# Patient Record
Sex: Female | Born: 1945 | Race: Black or African American | Hispanic: No | Marital: Single | Smoking: Former smoker
Health system: Southern US, Community
[De-identification: ages and names within clinical notes are randomized; demographics above are authoritative.]

## PROBLEM LIST (undated history)

## (undated) DIAGNOSIS — M1990 Unspecified osteoarthritis, unspecified site: Secondary | ICD-10-CM

## (undated) DIAGNOSIS — K449 Diaphragmatic hernia without obstruction or gangrene: Secondary | ICD-10-CM

## (undated) DIAGNOSIS — K5790 Diverticulosis of intestine, part unspecified, without perforation or abscess without bleeding: Secondary | ICD-10-CM

## (undated) DIAGNOSIS — R011 Cardiac murmur, unspecified: Secondary | ICD-10-CM

## (undated) DIAGNOSIS — K219 Gastro-esophageal reflux disease without esophagitis: Secondary | ICD-10-CM

## (undated) DIAGNOSIS — M810 Age-related osteoporosis without current pathological fracture: Secondary | ICD-10-CM

## (undated) DIAGNOSIS — I10 Essential (primary) hypertension: Secondary | ICD-10-CM

## (undated) DIAGNOSIS — I341 Nonrheumatic mitral (valve) prolapse: Secondary | ICD-10-CM

## (undated) HISTORY — PX: COLONOSCOPY: SHX174

## (undated) HISTORY — PX: KNEE SURGERY: SHX244

## (undated) HISTORY — DX: Age-related osteoporosis without current pathological fracture: M81.0

## (undated) HISTORY — PX: BACK SURGERY: SHX140

## (undated) HISTORY — PX: CARPAL TUNNEL RELEASE: SHX101

---

## 1997-08-23 ENCOUNTER — Other Ambulatory Visit: Admission: RE | Admit: 1997-08-23 | Discharge: 1997-08-23 | Payer: Self-pay | Admitting: Obstetrics & Gynecology

## 1997-12-27 ENCOUNTER — Other Ambulatory Visit: Admission: RE | Admit: 1997-12-27 | Discharge: 1997-12-27 | Payer: Self-pay | Admitting: Obstetrics & Gynecology

## 1998-09-13 ENCOUNTER — Other Ambulatory Visit: Admission: RE | Admit: 1998-09-13 | Discharge: 1998-09-13 | Payer: Self-pay | Admitting: Obstetrics & Gynecology

## 1999-06-03 ENCOUNTER — Inpatient Hospital Stay (HOSPITAL_COMMUNITY): Admission: EM | Admit: 1999-06-03 | Discharge: 1999-06-05 | Payer: Self-pay | Admitting: Emergency Medicine

## 1999-06-03 ENCOUNTER — Encounter: Payer: Self-pay | Admitting: Specialist

## 1999-06-04 ENCOUNTER — Encounter: Payer: Self-pay | Admitting: Specialist

## 1999-06-13 ENCOUNTER — Encounter: Payer: Self-pay | Admitting: Specialist

## 1999-06-14 ENCOUNTER — Encounter: Payer: Self-pay | Admitting: Specialist

## 1999-06-14 ENCOUNTER — Observation Stay (HOSPITAL_COMMUNITY): Admission: RE | Admit: 1999-06-14 | Discharge: 1999-06-15 | Payer: Self-pay | Admitting: Specialist

## 2000-10-29 ENCOUNTER — Other Ambulatory Visit: Admission: RE | Admit: 2000-10-29 | Discharge: 2000-10-29 | Payer: Self-pay | Admitting: Gastroenterology

## 2001-12-31 ENCOUNTER — Other Ambulatory Visit: Admission: RE | Admit: 2001-12-31 | Discharge: 2001-12-31 | Payer: Self-pay | Admitting: Obstetrics and Gynecology

## 2002-12-20 ENCOUNTER — Ambulatory Visit (HOSPITAL_COMMUNITY): Admission: RE | Admit: 2002-12-20 | Discharge: 2002-12-20 | Payer: Self-pay | Admitting: Orthopedic Surgery

## 2002-12-20 ENCOUNTER — Ambulatory Visit (HOSPITAL_BASED_OUTPATIENT_CLINIC_OR_DEPARTMENT_OTHER): Admission: RE | Admit: 2002-12-20 | Discharge: 2002-12-20 | Payer: Self-pay | Admitting: Orthopedic Surgery

## 2003-01-03 ENCOUNTER — Other Ambulatory Visit: Admission: RE | Admit: 2003-01-03 | Discharge: 2003-01-03 | Payer: Self-pay | Admitting: Obstetrics & Gynecology

## 2003-08-24 ENCOUNTER — Ambulatory Visit (HOSPITAL_COMMUNITY): Admission: RE | Admit: 2003-08-24 | Discharge: 2003-08-24 | Payer: Self-pay | Admitting: Gastroenterology

## 2003-11-07 ENCOUNTER — Ambulatory Visit (HOSPITAL_COMMUNITY): Admission: RE | Admit: 2003-11-07 | Discharge: 2003-11-07 | Payer: Self-pay | Admitting: Orthopedic Surgery

## 2003-11-07 ENCOUNTER — Ambulatory Visit (HOSPITAL_BASED_OUTPATIENT_CLINIC_OR_DEPARTMENT_OTHER): Admission: RE | Admit: 2003-11-07 | Discharge: 2003-11-07 | Payer: Self-pay | Admitting: Orthopedic Surgery

## 2004-01-16 ENCOUNTER — Other Ambulatory Visit: Admission: RE | Admit: 2004-01-16 | Discharge: 2004-01-16 | Payer: Self-pay | Admitting: Obstetrics & Gynecology

## 2011-11-16 ENCOUNTER — Encounter (HOSPITAL_BASED_OUTPATIENT_CLINIC_OR_DEPARTMENT_OTHER): Payer: Self-pay | Admitting: Emergency Medicine

## 2011-11-16 ENCOUNTER — Emergency Department (HOSPITAL_BASED_OUTPATIENT_CLINIC_OR_DEPARTMENT_OTHER)
Admission: EM | Admit: 2011-11-16 | Discharge: 2011-11-16 | Disposition: A | Discharge status: Home / Self Care | Payer: Self-pay | Attending: Emergency Medicine | Admitting: Emergency Medicine

## 2011-11-16 DIAGNOSIS — I10 Essential (primary) hypertension: Secondary | ICD-10-CM | POA: Insufficient documentation

## 2011-11-16 DIAGNOSIS — M129 Arthropathy, unspecified: Secondary | ICD-10-CM | POA: Insufficient documentation

## 2011-11-16 DIAGNOSIS — I059 Rheumatic mitral valve disease, unspecified: Secondary | ICD-10-CM | POA: Insufficient documentation

## 2011-11-16 DIAGNOSIS — N949 Unspecified condition associated with female genital organs and menstrual cycle: Secondary | ICD-10-CM | POA: Insufficient documentation

## 2011-11-16 DIAGNOSIS — K219 Gastro-esophageal reflux disease without esophagitis: Secondary | ICD-10-CM | POA: Insufficient documentation

## 2011-11-16 DIAGNOSIS — R350 Frequency of micturition: Secondary | ICD-10-CM | POA: Insufficient documentation

## 2011-11-16 DIAGNOSIS — R3915 Urgency of urination: Secondary | ICD-10-CM | POA: Insufficient documentation

## 2011-11-16 DIAGNOSIS — R102 Pelvic and perineal pain: Secondary | ICD-10-CM

## 2011-11-16 DIAGNOSIS — Z882 Allergy status to sulfonamides status: Secondary | ICD-10-CM | POA: Insufficient documentation

## 2011-11-16 HISTORY — DX: Diaphragmatic hernia without obstruction or gangrene: K44.9

## 2011-11-16 HISTORY — DX: Gastro-esophageal reflux disease without esophagitis: K21.9

## 2011-11-16 HISTORY — DX: Nonrheumatic mitral (valve) prolapse: I34.1

## 2011-11-16 HISTORY — DX: Unspecified osteoarthritis, unspecified site: M19.90

## 2011-11-16 HISTORY — DX: Essential (primary) hypertension: I10

## 2011-11-16 HISTORY — DX: Diverticulosis of intestine, part unspecified, without perforation or abscess without bleeding: K57.90

## 2011-11-16 LAB — URINE MICROSCOPIC-ADD ON

## 2011-11-16 LAB — URINALYSIS, ROUTINE W REFLEX MICROSCOPIC
Bilirubin Urine: NEGATIVE
Glucose, UA: NEGATIVE mg/dL
Hgb urine dipstick: NEGATIVE
Ketones, ur: NEGATIVE mg/dL
Nitrite: NEGATIVE
Protein, ur: NEGATIVE mg/dL
Specific Gravity, Urine: 1.008 (ref 1.005–1.030)
Urobilinogen, UA: 0.2 mg/dL (ref 0.0–1.0)
pH: 7 (ref 5.0–8.0)

## 2011-11-16 NOTE — ED Notes (Signed)
Pt states she has been having ongoing problems with dysuria and possible vaginal swelling since April.  Pt states some burning, some frequency and difficulty with emptying bladder.   No known fever.  Has been seen numerous times for same.  Pt states symptoms are progressively worsening.

## 2011-11-16 NOTE — ED Provider Notes (Signed)
History     CSN: [id number]  Arrival date & time 11/16/11  1035   First MD Initiated Contact with Patient 11/16/11 1059      Chief Complaint  Patient presents with  . Dysuria  . vaginal swelling     (Consider location/radiation/quality/duration/timing/severity/associated sxs/prior treatment) HPI Pt reports several months of bladder problems, described as a swelling or mass in her vagina with standing, urinary urgency and frequency with difficult completely voiding. She has been seen by Urology in [facility] and at [facility] multiple times. It seems they are concerned about possible bladder prolapse but surgical intervention has been delayed by lack of insurance. The patient has applied for Medicaid within the last week. She presents to the ED today for re-evaluation. None of her prior evaluation is available in our medical record.   Past Medical History  Diagnosis Date  . Hypertension   . Hiatal hernia   . GERD (gastroesophageal reflux disease)   . Diverticulosis   . Arthritis   . Mitral valve prolapse     Past Surgical History  Procedure Date  . Back surgery   . Carpal tunnel release   . Knee surgery     No family history on file.  History  Substance Use Topics  . Smoking status: Not on file  . Smokeless tobacco: Not on file  . Alcohol Use:     OB History    Grav Para Term Preterm Abortions TAB SAB Ect Mult Living                  Review of Systems All other systems reviewed and are negative except as noted in HPI.   Allergies  Sulfa antibiotics  Home Medications   Current Outpatient Rx  Name Route Sig Dispense Refill  . AMITRIPTYLINE HCL 25 MG PO TABS Oral Take 25 mg by mouth at bedtime.    Marland Kitchen AMLODIPINE BESYLATE 5 MG PO TABS Oral Take 5 mg by mouth daily.    Marland Kitchen ESOMEPRAZOLE MAGNESIUM 20 MG PO CPDR Oral Take 20 mg by mouth daily before breakfast.    . HYDROCHLOROTHIAZIDE 12.5 MG PO CAPS Oral Take 12.5 mg by mouth daily.    Marland Kitchen METOPROLOL TARTRATE 50 MG PO  TABS Oral Take 50 mg by mouth 2 (two) times daily.      BP 163/67  Pulse 95  Temp 98.1 F (36.7 C) (Oral)  Resp 16  Ht 5\' 3"  (1.6 m)  Wt 165 lb (74.844 kg)  BMI 29.23 kg/m2  SpO2 98%  Physical Exam  Nursing note and vitals reviewed. Constitutional: She is oriented to person, place, and time. She appears well-developed and well-nourished.  HENT:  Head: Normocephalic and atraumatic.  Eyes: EOM are normal. Pupils are equal, round, and reactive to light.  Neck: Normal range of motion. Neck supple.  Cardiovascular: Normal rate, normal heart sounds and intact distal pulses.   Pulmonary/Chest: Effort normal and breath sounds normal.  Abdominal: Bowel sounds are normal. She exhibits no distension. There is no tenderness.  Genitourinary: Vagina normal.       No uterine or bladder prolapse on supine exam  Musculoskeletal: Normal range of motion. She exhibits no edema and no tenderness.  Neurological: She is alert and oriented to person, place, and time. She has normal strength. No cranial nerve deficit or sensory deficit.  Skin: Skin is warm and dry. No rash noted.  Psychiatric: She has a normal mood and affect.    ED Course  Procedures (including  critical care time)  Labs Reviewed  URINALYSIS, ROUTINE W REFLEX MICROSCOPIC - Abnormal; Notable for the following:    Leukocytes, UA SMALL (*)     All other components within normal limits  URINE MICROSCOPIC-ADD ON   No results found.   No diagnosis found.    MDM  Pt advised Urology and/or Gyn followup for further evaluation and management. Suspect bladder prolapse or possibly uterine prolapse. UA is neg.         Jaquille Kau B. Bernette Mayers, MD 11/16/11 1231

## 2011-12-05 ENCOUNTER — Ambulatory Visit (INDEPENDENT_AMBULATORY_CARE_PROVIDER_SITE_OTHER): Payer: Self-pay | Admitting: Obstetrics & Gynecology

## 2011-12-05 ENCOUNTER — Encounter: Payer: Self-pay | Admitting: Obstetrics & Gynecology

## 2011-12-05 VITALS — BP 125/73 | HR 80 | Temp 97.4°F | Ht 62.0 in | Wt 165.8 lb

## 2011-12-05 DIAGNOSIS — N819 Female genital prolapse, unspecified: Secondary | ICD-10-CM | POA: Insufficient documentation

## 2011-12-05 DIAGNOSIS — N393 Stress incontinence (female) (male): Secondary | ICD-10-CM

## 2011-12-05 NOTE — Progress Notes (Signed)
Patient ID: Brenda Mccullough, female   DOB: Feb 21, 1946, 66 y.o.   MRN: [id number]  Chief Complaint  Patient presents with  . Follow-up    from ED, possible bladder prolapse     HPI Brenda Mccullough is a 66 y.o. female.  [id number] No LMP recorded. Patient is postmenopausal. Over one year of symptoms of pelvic prolapse, urinary incontinence, has been seen by urology at [facility] and [facility]. Had MRI in [place] HPI  Past Medical History  Diagnosis Date  . Hypertension   . Hiatal hernia   . GERD (gastroesophageal reflux disease)   . Diverticulosis   . Arthritis   . Mitral valve prolapse     Past Surgical History  Procedure Date  . Back surgery   . Carpal tunnel release   . Knee surgery     No family history on file.  Social History History  Substance Use Topics  . Smoking status: Former Games developer  . Smokeless tobacco: Never Used  . Alcohol Use: Not on file    Allergies  Allergen Reactions  . Sulfa Antibiotics     Current Outpatient Prescriptions  Medication Sig Dispense Refill  . amitriptyline (ELAVIL) 25 MG tablet Take 25 mg by mouth at bedtime.      Marland Kitchen amLODipine (NORVASC) 5 MG tablet Take 5 mg by mouth daily.      Marland Kitchen esomeprazole (NEXIUM) 20 MG capsule Take 20 mg by mouth daily before breakfast.      . hydrochlorothiazide (MICROZIDE) 12.5 MG capsule Take 12.5 mg by mouth daily.      . metoprolol (LOPRESSOR) 50 MG tablet Take 50 mg by mouth 2 (two) times daily.        Review of Systems Review of Systems  Constitutional: Negative for activity change.  Genitourinary: Positive for urgency. Negative for dysuria.       Stress incontinence Prolapse symptoms when on her feet for prolonged periods    Blood pressure 125/73, pulse 80, temperature 97.4 F (36.3 C), temperature source Oral, height 5\' 2"  (1.575 m), weight 165 lb 12.8 oz (75.206 kg).  Physical Exam Physical Exam  Constitutional: She appears well-developed. No distress.  Pulmonary/Chest: Effort normal. No  respiratory distress.  Abdominal: Soft. She exhibits no distension and no mass. There is no tenderness.  Genitourinary: Vagina normal and uterus normal. No vaginal discharge found.       No prolapse on brief standing. Pap done  Skin: Skin is warm and dry.  Psychiatric: She has a normal mood and affect. Her behavior is normal.    Data Reviewed   Assessment    Reports prolapse and incontinence    Plan    Records from urology Consider pessary-ring with incontinence support       ARNOLD,JAMES 12/05/2011, 3:24 PM

## 2011-12-05 NOTE — Patient Instructions (Signed)
Prolapse  Prolapse means the falling down, bulging, dropping, or drooping of a body part. Organs that commonly prolapse include the rectum, small intestine, bladder, urethra, vagina (birth canal), uterus (womb), and cervix. Prolapse occurs when the ligaments and muscle tissue around the rectum, bladder, and uterus are damaged or weakened.  CAUSES  This happens especially with:  Childbirth. Some women feel pelvic pressure or have trouble holding their urine right after childbirth, because of stretching and tearing of pelvic tissues. This generally gets better with time and the feeling usually goes away, but it may return with aging.  Chronic heavy lifting.  Aging.  Menopause, with loss of estrogen production weakening the pelvic ligaments and muscles.  Past pelvic surgery.  Obesity.  Chronic constipation.  Chronic cough. Prolapse may affect a single organ, or several organs may prolapse at the same time. The front wall of the vagina holds up the bladder. The back wall holds up part of the lower intestine, or rectum. The uterus fills a spot in the middle. All these organs can be involved when the ligaments and muscles around the vagina relax too much. This often gets worse when women stop producing estrogen (menopause). SYMPTOMS  Uncontrolled loss of urine (incontinence) with cough, sneeze, straining, and exercise.  More force may be required to have a bowel movement, due to trapping of the stool.  When part of an organ bulges through the opening of the vagina, there is sometimes a feeling of heaviness or pressure. It may feel as though something is falling out. This sensation increases with coughing or bearing down.  If the organs protrude through the opening of the vagina and rub against the clothing, there may be soreness, ulcers, infection, pain, and bleeding.  Lower back pain.  Pushing in the upper or lower part of the vagina, to pass urine or have a bowel movement.  Problems  having sexual intercourse.  Being unable to insert a tampon or applicator. DIAGNOSIS  Usually, a physical exam is all that is needed to identify the problem. During the examination, you may be asked to cough and strain while lying down, sitting up, and standing up. Your caregiver will determine if more testing is required, such as bladder function tests. Some diagnoses are:  Cystocele: Bulging and falling of the bladder into the top of the vagina.  Rectocele: Part of the rectum bulging into the vagina.  Prolapse of the uterus: The uterus falls or drops into the vagina.  Enterocele: Bulging of the top of the vagina, after a hysterectomy (uterus removal), with the small intestine bulging into the vagina. A hernia in the top of the vagina.  Urethrocele: The urethra (urine carrying tube) bulging into the vagina. TREATMENT  In most cases, prolapse needs to be treated only if it produces symptoms. If the symptoms are interfering with your usual daily or sexual activities, treatment may be necessary. The following are some measures that may be used to treat prolapse.  Estrogen may help elderly women with mild prolapse.  Kegel exercises may help mild cases of prolapse, by strengthening and tightening the muscles of the pelvic floor.  Pessaries are used in women who choose not to, or are unable to, have surgery. A pessary is a doughnut-shaped piece of plastic or rubber that is put into the vagina to keep the organs in place. This device must be fitted by your caregiver. Your caregiver will also explain how to care for yourself with the pessary. If it works well for you,   to keep the organs in place. This device must be fitted by your caregiver. Your caregiver will also explain how to care for yourself with the pessary. If it works well for you, this may be the only treatment required.   Surgery is often the only form of treatment for more severe prolapses. There are different types of surgery available. You should discuss what the best procedure is for you. If the uterus is prolapsed, it may be removed (hysterectomy) as part of the surgical treatment. Your caregiver will  discuss the risks and benefits with you.   Uterine-vaginal suspension (surgery to hold up the organs) may be used, especially if you want to maintain your fertility.  No form of treatment is guaranteed to correct the prolapse or relieve the symptoms.  HOME CARE INSTRUCTIONS    Wear a sanitary pad or absorbent product if you have incontinence of urine.   Avoid heavy lifting and straining with exercise and work.   Take over-the-counter pain medicine for minor discomfort.   Try taking estrogen or using estrogen vaginal cream.   Try Kegel exercises or use a pessary, before deciding to have surgery.   Do Kegel exercises after having a baby.  SEEK MEDICAL CARE IF:    Your symptoms interfere with your daily activities.   You need medicine to help with the discomfort.   You need to be fitted with a pessary.   You notice bleeding from the vagina.   You think you have ulcers or you notice ulcers on the cervix.   You have an oral temperature above 102 F (38.9 C).   You develop pain or blood with urination.   You have bleeding with a bowel movement.   The symptoms are interfering with your sex life.   You have urinary incontinence that interferes with your daily activities.   You lose urine with sexual intercourse.   You have a chronic cough.   You have chronic constipation.  Document Released: 08/18/2002 Document Revised: 05/06/2011 Document Reviewed: 02/26/2009  [employer] Patient Information 2013 [employer].

## 2011-12-05 NOTE — Addendum Note (Signed)
Addended by: Sherre Lain A on: 12/05/2011 03:47 PM   Modules accepted: Orders

## 2011-12-11 ENCOUNTER — Telehealth: Payer: Self-pay | Admitting: *Deleted

## 2011-12-11 NOTE — Telephone Encounter (Signed)
Called pt and discussed her spotting. She states that she had 1 episode "the other day while I was @ my job". She denies spotting today. I informed pt that she does not need to be seen sooner than her scheduled appt on 12/23/11. She should make the doctor aware of her spotting episode and any additional bleeding that she has between now and the appt. Pt voiced understanding.

## 2011-12-11 NOTE — Telephone Encounter (Signed)
Patient left a message stating that she has started spotting yesterday. She has a followup with Dr. Debroah Loop on 10/28. She wants to know if she needs to come in sooner since she has started the spotting.

## 2011-12-23 ENCOUNTER — Ambulatory Visit (INDEPENDENT_AMBULATORY_CARE_PROVIDER_SITE_OTHER): Payer: Self-pay | Admitting: Obstetrics & Gynecology

## 2011-12-23 ENCOUNTER — Encounter: Payer: Self-pay | Admitting: Obstetrics & Gynecology

## 2011-12-23 VITALS — BP 148/78 | HR 80 | Temp 97.3°F | Ht 62.0 in | Wt 166.6 lb

## 2011-12-23 DIAGNOSIS — IMO0002 Reserved for concepts with insufficient information to code with codable children: Secondary | ICD-10-CM

## 2011-12-23 DIAGNOSIS — N8111 Cystocele, midline: Secondary | ICD-10-CM

## 2011-12-23 DIAGNOSIS — N393 Stress incontinence (female) (male): Secondary | ICD-10-CM

## 2011-12-23 DIAGNOSIS — Z23 Encounter for immunization: Secondary | ICD-10-CM

## 2011-12-23 MED ORDER — INFLUENZA VIRUS VACC SPLIT PF IM SUSP
0.5000 mL | Freq: Once | INTRAMUSCULAR | Status: AC
  Administered 2011-12-23: 0.5 mL via INTRAMUSCULAR

## 2011-12-23 NOTE — Addendum Note (Signed)
Addended by: Allie Bossier on: 12/23/2011 02:23 PM   Modules accepted: Orders

## 2011-12-23 NOTE — Progress Notes (Signed)
Here today for pessary fitting. States still having problems sometimes when passing urine , states sometimes it feels like a baby's head coming out and she has seen it too.

## 2011-12-23 NOTE — Progress Notes (Signed)
  Subjective:    Patient ID: Brenda Mccullough, female    DOB: 01-13-1946, 66 y.o.   MRN: [id number]  HPI  66 yo abstinent lady who has been dealing with a cystocele and GSUI for about a year. She is here today for a pessary fitting. Review of Systems     Objective:   Physical Exam  A #4 ring pessary was placed without difficulty. This relieved her symptoms. She was able to remove and insert it.  Minimal atrophy       Assessment & Plan:  Cystocele and GSUI I will see her back in a month. If there is any vaginal abrasion, I will add vaginal estrogen.

## 2011-12-23 NOTE — Addendum Note (Signed)
Addended by: Freddi Starr on: 12/23/2011 02:25 PM   Modules accepted: Orders

## 2011-12-26 ENCOUNTER — Telehealth: Payer: Self-pay | Admitting: *Deleted

## 2011-12-26 NOTE — Telephone Encounter (Signed)
Pt left message that she was in office on 10/28 and received device to hold her bladder up from Dr. Marice Potter. She was unable to remove it yesterday and thinks it may be too small. Pt states that Dr. Marice Potter told her she could return if it was not the right size. Please call back.  I consulted with Dr. Marice Potter, then called pt.  She stated that she was finally able to remove the pessary and did not re-insert it for fear that she would not be able to get it out again. I advised pt to re-insert the pessary tonight or tomorrow morning and then to leave it in for a couple of days. If she has difficulty removing it again, she should call us back next week. She also should try different positions while removing the pessary such as squatting, standing in the shower or lying down.  Pt voiced understanding.

## 2012-01-22 ENCOUNTER — Ambulatory Visit: Payer: Self-pay | Admitting: Obstetrics & Gynecology

## 2012-02-05 ENCOUNTER — Ambulatory Visit (INDEPENDENT_AMBULATORY_CARE_PROVIDER_SITE_OTHER): Payer: Self-pay | Admitting: Obstetrics & Gynecology

## 2012-02-05 ENCOUNTER — Encounter: Payer: Self-pay | Admitting: Obstetrics & Gynecology

## 2012-02-05 VITALS — BP 142/75 | HR 81 | Temp 98.7°F | Ht 63.0 in | Wt 166.8 lb

## 2012-02-05 DIAGNOSIS — N814 Uterovaginal prolapse, unspecified: Secondary | ICD-10-CM

## 2012-02-05 DIAGNOSIS — M25569 Pain in unspecified knee: Secondary | ICD-10-CM

## 2012-02-05 MED ORDER — AMITRIPTYLINE HCL 25 MG PO TABS: 25.0000 mg | ORAL_TABLET | Freq: Every day | ORAL | Status: DC

## 2012-02-05 NOTE — Patient Instructions (Signed)
Prolapse  Prolapse means the falling down, bulging, dropping, or drooping of a body part. Organs that commonly prolapse include the rectum, small intestine, bladder, urethra, vagina (birth canal), uterus (womb), and cervix. Prolapse occurs when the ligaments and muscle tissue around the rectum, bladder, and uterus are damaged or weakened.  CAUSES  This happens especially with:  Childbirth. Some women feel pelvic pressure or have trouble holding their urine right after childbirth, because of stretching and tearing of pelvic tissues. This generally gets better with time and the feeling usually goes away, but it may return with aging.  Chronic heavy lifting.  Aging.  Menopause, with loss of estrogen production weakening the pelvic ligaments and muscles.  Past pelvic surgery.  Obesity.  Chronic constipation.  Chronic cough. Prolapse may affect a single organ, or several organs may prolapse at the same time. The front wall of the vagina holds up the bladder. The back wall holds up part of the lower intestine, or rectum. The uterus fills a spot in the middle. All these organs can be involved when the ligaments and muscles around the vagina relax too much. This often gets worse when women stop producing estrogen (menopause). SYMPTOMS  Uncontrolled loss of urine (incontinence) with cough, sneeze, straining, and exercise.  More force may be required to have a bowel movement, due to trapping of the stool.  When part of an organ bulges through the opening of the vagina, there is sometimes a feeling of heaviness or pressure. It may feel as though something is falling out. This sensation increases with coughing or bearing down.  If the organs protrude through the opening of the vagina and rub against the clothing, there may be soreness, ulcers, infection, pain, and bleeding.  Lower back pain.  Pushing in the upper or lower part of the vagina, to pass urine or have a bowel movement.  Problems  having sexual intercourse.  Being unable to insert a tampon or applicator. DIAGNOSIS  Usually, a physical exam is all that is needed to identify the problem. During the examination, you may be asked to cough and strain while lying down, sitting up, and standing up. Your caregiver will determine if more testing is required, such as bladder function tests. Some diagnoses are:  Cystocele: Bulging and falling of the bladder into the top of the vagina.  Rectocele: Part of the rectum bulging into the vagina.  Prolapse of the uterus: The uterus falls or drops into the vagina.  Enterocele: Bulging of the top of the vagina, after a hysterectomy (uterus removal), with the small intestine bulging into the vagina. A hernia in the top of the vagina.  Urethrocele: The urethra (urine carrying tube) bulging into the vagina. TREATMENT  In most cases, prolapse needs to be treated only if it produces symptoms. If the symptoms are interfering with your usual daily or sexual activities, treatment may be necessary. The following are some measures that may be used to treat prolapse.  Estrogen may help elderly women with mild prolapse.  Kegel exercises may help mild cases of prolapse, by strengthening and tightening the muscles of the pelvic floor.  Pessaries are used in women who choose not to, or are unable to, have surgery. A pessary is a doughnut-shaped piece of plastic or rubber that is put into the vagina to keep the organs in place. This device must be fitted by your caregiver. Your caregiver will also explain how to care for yourself with the pessary. If it works well for you,   this may be the only treatment required.  Surgery is often the only form of treatment for more severe prolapses. There are different types of surgery available. You should discuss what the best procedure is for you. If the uterus is prolapsed, it may be removed (hysterectomy) as part of the surgical treatment. Your caregiver will  discuss the risks and benefits with you.  Uterine-vaginal suspension (surgery to hold up the organs) may be used, especially if you want to maintain your fertility. No form of treatment is guaranteed to correct the prolapse or relieve the symptoms. HOME CARE INSTRUCTIONS   Wear a sanitary pad or absorbent product if you have incontinence of urine.  Avoid heavy lifting and straining with exercise and work.  Take over-the-counter pain medicine for minor discomfort.  Try taking estrogen or using estrogen vaginal cream.  Try Kegel exercises or use a pessary, before deciding to have surgery.  Do Kegel exercises after having a baby. SEEK MEDICAL CARE IF:   Your symptoms interfere with your daily activities.  You need medicine to help with the discomfort.  You need to be fitted with a pessary.  You notice bleeding from the vagina.  You think you have ulcers or you notice ulcers on the cervix.  You have an oral temperature above 102 F (38.9 C).  You develop pain or blood with urination.  You have bleeding with a bowel movement.  The symptoms are interfering with your sex life.  You have urinary incontinence that interferes with your daily activities.  You lose urine with sexual intercourse.  You have a chronic cough.  You have chronic constipation. Document Released: 08/18/2002 Document Revised: 05/06/2011 Document Reviewed: 02/26/2009 [facility] Patient Information 2013 [place], [place]. Cystocele Repair A cystocele is a bulging, drooping hernia or break (rupture) of bladder tissue into the birth canal (vagina). This bulging or rupture occurs on the top front wall of the vagina. CAUSES  Cystocele is associated with weakness of the top front wall of the vagina due to stretching and tearing of the ligaments and muscles in the area. This is often the result of:  Multiple childbirths.  Continuous heavy lifting.  Chronic cough from asthma, emphysema, or smoking.  Being  overweight.  Changes from aging.  Previous surgery in the vaginal area.  Menopause with loss of estrogen hormone and weakening of the ligaments and muscles around the bladder. SYMPTOMS   Uncontrolled loss of urine (incontinence) with cough, sneeze, or exercise.  Pelvic pressure.  Frequency or urgency to urinate because of inability to completely empty the bladder.  Bladder infections.  Needing to push on the upper vagina to help yourself pass urine. DIAGNOSIS  A cystocele can be diagnosed by doing a pelvic exam and observing the top of the vagina drooping or bulging into or out of the vagina. TREATMENT  Surgical options:  Cystocele repair is surgery that removes the hernia.  There are also different "sling" operations that may be used. Discuss the different types of surgeries to repair a cystocele with your caregiver. Your caregiver will decide what type of surgery will be best in your case. Nonsurgical options:  Kegel exercises. This helps strengthen and tighten the muscles and tissue in and around the bladder and vagina. This may help with mild cases of cystocele.  A pessary may help the cystocele. A pessary is a plastic or rubber device that lifts the bladder into place. A pessary must be fitted by a doctor.  Tampons or diaphragms that lift the bladder  into place are sometimes helpful with a minor or small cystocele.  Estrogen may help with mild cases in menopausal and aging women. LET YOUR CAREGIVER KNOW ABOUT:   Allergies to food or medicine.  Medicines taken, including vitamins, herbs, eyedrops, over-the-counter medicines, and creams.  Use of steroids (by mouth or creams).  Previous problems with anesthetics or numbing medicines.  History of bleeding problems or blood clots.  Previous surgery.  Other health problems, including diabetes and kidney problems.  Possibility of pregnancy, if this applies. RISKS AND COMPLICATIONS  All surgery is associated with  risks.  There are risks with a general anesthesia. You should discuss this with your caregiver.  With spinal or epidural anesthesia, there may be an area that is not numbed, and you could feel pain.  Headache could occur with a spinal or epidural anesthetic.  The catheter you will have after surgery may not work properly or may get blocked and need to be replaced.  Excessive bleeding.  Infection.  Injury to surrounding structures.  Recurrence of the cystocele.  Surgery may not get rid of your symptoms. BEFORE THE PROCEDURE   Do not take aspirin or blood thinners for 1 week prior to surgery, unless instructed otherwise.  Do not eat or drink anything after midnight the night before surgery.  Let your caregiver know if you develop a cold or other infectious problems prior to surgery.  If being admitted the day of surgery, you should be present 1 hour prior to your procedure or as directed by your caregiver.  Plan and arrange for help when you go home from the hospital.  If you smoke, do not smoke for at least 2 weeks before the surgery.  Do not drink any alcohol for 3 days before the surgery. PROCEDURE  You will be given an anesthetic to prevent you from feeling pain during surgery. This may be a general anesthetic that puts you to sleep, or a spinal or epidural anesthetic. You will be asleep or be numbed through the entire procedure. During cystocele repair, tissue is pulled from the sides and around the top of the vagina to lift up the hernia. This removes the hernia so that the top of the vagina does not fall into the opening of the vagina. AFTER THE PROCEDURE  After surgery, you will be taken to the recovery room where a nurse will take care of you, checking your breathing, blood pressure, pulse, and your progress. When your caregiver feels you are stable, you will be taken to your room. You will have a drainage tube (Foley catheter) that will drain your bladder for 2 to 7 days  or longer, until your bladder is working properly. This catheter is placed prior to surgery to help keep your bladder empty and out of the way during the procedure. After surgery, this will make passing your urine easier. The catheter will be removed when you can easily pass urine without this assistance. You may have gauze packing in the vagina that will be removed 1 to 2 days after the surgery. Usually, you will be given a medicine (antibiotic) that kills germs. You will be given pain medicine as needed. You can usually go home in 3 to 5 days. HOME CARE INSTRUCTIONS   Do not take baths. Take showers until your caregiver informs you otherwise.  Take antibiotics as directed by your caregiver.  Exercise as instructed. Do not perform exercises which increase the pressure inside your belly (abdomen), such as sit-ups or lifting  weights, until your caregiver has given permission. Walking exercise is preferred.  Only take over-the-counter or prescription medicines for pain and discomfort as directed by your caregiver.  Do not drink alcohol while taking pain medicine.  Do not lift anything over 5 pounds.  Do not drive until your caregiver gives you permission.  Get plenty of rest and sleep.  Have someone help with your household chores for 1 to 2 weeks.  If you develop constipation, you may take a mild laxative with your caregiver's permission. Eating bran foods and drinking enough water and fluids to keep your urine clear or pale yellow helps with constipation.  Do not take aspirin. It may cause bleeding.  You may resume normal diet and unstrenuous activities as directed.  Do not douche, use tampons, or engage in intercourse until your surgeon has given permission.  Change bandages (dressings) as directed.  Make and keep all your postoperative appointments. SEEK MEDICAL CARE IF:   You have abnormal vaginal discharge.  You develop a rash.  You are having a reaction to your  medicine.  You develop nausea or vomiting. SEEK IMMEDIATE MEDICAL CARE IF:   You have redness, swelling, or increasing pain in the vaginal area.  You notice pus coming from the vagina.  You have a fever.  You notice a bad smell coming from the vagina.  You have increasing abdominal pain.  You have frequent urination or you notice burning during urination.  You notice blood in your urine.  You have excessive vaginal bleeding.  You cannot urinate. MAKE SURE YOU:   Understand these instructions.  Will watch your condition.  Will get help right away if you are not doing well or get worse. Document Released: 02/09/2000 Document Revised: 05/06/2011 Document Reviewed: 05/11/2009 [facility] Patient Information 2013 [place], [place].

## 2012-02-05 NOTE — Progress Notes (Signed)
Subjective:     Patient ID: Brenda Mccullough, female   DOB: 10/04/1945, 66 y.o.   MRN: [id number]  HPI Pt s/p pessary placement for cystocele.  Pt does not like taking it in and out but, does not like to remove or replace it.  Her sx are greatly improved now that she has her pessary.    Review of Systems     Objective:   Physical ExamBP 142/75  Pulse 81  Temp 98.7 F (37.1 C)  Ht 5\' 3"  (1.6 m)  Wt 166 lb 12.8 oz (75.66 kg)  BMI 29.55 kg/m2 GU: EGBUS: no lesions Vagina: no blood in vault; no excoriations. Pessary checked prior to pt taking out the pessary and it was positioned very well       Assessment:     Cystocele- pessary check.  Pt would like to consider surgery as she does not like removing her pessary Knee pain     Plan:     Refilled Amytriptyline 25mg  qHS F/u 6 weeks  To reassess desire for surgery  Ramla Hase L. Harraway-Smith, M.D., [place] [place]

## 2012-03-19 ENCOUNTER — Ambulatory Visit: Payer: Self-pay | Admitting: Obstetrics & Gynecology

## 2012-04-01 ENCOUNTER — Ambulatory Visit (INDEPENDENT_AMBULATORY_CARE_PROVIDER_SITE_OTHER): Payer: No Typology Code available for payment source | Admitting: Obstetrics & Gynecology

## 2012-04-01 ENCOUNTER — Encounter: Payer: Self-pay | Admitting: Obstetrics & Gynecology

## 2012-04-01 VITALS — BP 137/74 | HR 80 | Temp 98.2°F | Ht 62.0 in | Wt 168.4 lb

## 2012-04-01 DIAGNOSIS — N814 Uterovaginal prolapse, unspecified: Secondary | ICD-10-CM

## 2012-04-01 DIAGNOSIS — N812 Incomplete uterovaginal prolapse: Secondary | ICD-10-CM

## 2012-04-01 MED ORDER — ACETIC ACID-OXYQUINOLINE 0.9-0.025 % VA GEL: 1.0000 | VAGINAL | Status: DC

## 2012-04-01 NOTE — Progress Notes (Signed)
Subjective:     Patient ID: Brenda Mccullough, female   DOB: 1945-10-15, 67 y.o.   MRN: [id number]  HPI Pt has pessary.  She was worried that she wouldn't like the pessary but, since her last visit she feels that it is working well.  She changes it every night because she does not like to sleep in it.  She denies leakage of urine.     Review of Systems     Objective:   Physical ExamBP 137/74  Pulse 80  Temp 98.2 F (36.8 C) (Oral)  Ht 5\' 2"  (1.575 m)  Wt 168 lb 6.4 oz (76.386 kg)  BMI 30.80 kg/m2 Pt in NAD  AO:ZHYQMVH removed EGBUS: no lesions Vagina: no blood in vault; tissue well healed Cervix: no lesion; no mucopurulent d/c Uterus: small, mobile; prolapsed Pessary replaced        Assessment:     Cystocele with uterine prolapse.  Pt satisfied with pessary.  Does not want surgery at present.   Plan:     F/u 6 months or sooner prn [employer] gel with change of pessary  Charlotta Lapaglia L. Harraway-Smith, M.D., [place] [place]

## 2012-04-01 NOTE — Patient Instructions (Addendum)
Prolapse  Prolapse means the falling down, bulging, dropping, or drooping of a body part. Organs that commonly prolapse include the rectum, small intestine, bladder, urethra, vagina (birth canal), uterus (womb), and cervix. Prolapse occurs when the ligaments and muscle tissue around the rectum, bladder, and uterus are damaged or weakened.  CAUSES  This happens especially with:  Childbirth. Some women feel pelvic pressure or have trouble holding their urine right after childbirth, because of stretching and tearing of pelvic tissues. This generally gets better with time and the feeling usually goes away, but it may return with aging.  Chronic heavy lifting.  Aging.  Menopause, with loss of estrogen production weakening the pelvic ligaments and muscles.  Past pelvic surgery.  Obesity.  Chronic constipation.  Chronic cough. Prolapse may affect a single organ, or several organs may prolapse at the same time. The front wall of the vagina holds up the bladder. The back wall holds up part of the lower intestine, or rectum. The uterus fills a spot in the middle. All these organs can be involved when the ligaments and muscles around the vagina relax too much. This often gets worse when women stop producing estrogen (menopause). SYMPTOMS  Uncontrolled loss of urine (incontinence) with cough, sneeze, straining, and exercise.  More force may be required to have a bowel movement, due to trapping of the stool.  When part of an organ bulges through the opening of the vagina, there is sometimes a feeling of heaviness or pressure. It may feel as though something is falling out. This sensation increases with coughing or bearing down.  If the organs protrude through the opening of the vagina and rub against the clothing, there may be soreness, ulcers, infection, pain, and bleeding.  Lower back pain.  Pushing in the upper or lower part of the vagina, to pass urine or have a bowel movement.  Problems  having sexual intercourse.  Being unable to insert a tampon or applicator. DIAGNOSIS  Usually, a physical exam is all that is needed to identify the problem. During the examination, you may be asked to cough and strain while lying down, sitting up, and standing up. Your caregiver will determine if more testing is required, such as bladder function tests. Some diagnoses are:  Cystocele: Bulging and falling of the bladder into the top of the vagina.  Rectocele: Part of the rectum bulging into the vagina.  Prolapse of the uterus: The uterus falls or drops into the vagina.  Enterocele: Bulging of the top of the vagina, after a hysterectomy (uterus removal), with the small intestine bulging into the vagina. A hernia in the top of the vagina.  Urethrocele: The urethra (urine carrying tube) bulging into the vagina. TREATMENT  In most cases, prolapse needs to be treated only if it produces symptoms. If the symptoms are interfering with your usual daily or sexual activities, treatment may be necessary. The following are some measures that may be used to treat prolapse.  Estrogen may help elderly women with mild prolapse.  Kegel exercises may help mild cases of prolapse, by strengthening and tightening the muscles of the pelvic floor.  Pessaries are used in women who choose not to, or are unable to, have surgery. A pessary is a doughnut-shaped piece of plastic or rubber that is put into the vagina to keep the organs in place. This device must be fitted by your caregiver. Your caregiver will also explain how to care for yourself with the pessary. If it works well for you,   to keep the organs in place. This device must be fitted by your caregiver. Your caregiver will also explain how to care for yourself with the pessary. If it works well for you, this may be the only treatment required.   Surgery is often the only form of treatment for more severe prolapses. There are different types of surgery available. You should discuss what the best procedure is for you. If the uterus is prolapsed, it may be removed (hysterectomy) as part of the surgical treatment. Your caregiver will  discuss the risks and benefits with you.   Uterine-vaginal suspension (surgery to hold up the organs) may be used, especially if you want to maintain your fertility.  No form of treatment is guaranteed to correct the prolapse or relieve the symptoms.  HOME CARE INSTRUCTIONS    Wear a sanitary pad or absorbent product if you have incontinence of urine.   Avoid heavy lifting and straining with exercise and work.   Take over-the-counter pain medicine for minor discomfort.   Try taking estrogen or using estrogen vaginal cream.   Try Kegel exercises or use a pessary, before deciding to have surgery.   Do Kegel exercises after having a baby.  SEEK MEDICAL CARE IF:    Your symptoms interfere with your daily activities.   You need medicine to help with the discomfort.   You need to be fitted with a pessary.   You notice bleeding from the vagina.   You think you have ulcers or you notice ulcers on the cervix.   You have an oral temperature above 102 F (38.9 C).   You develop pain or blood with urination.   You have bleeding with a bowel movement.   The symptoms are interfering with your sex life.   You have urinary incontinence that interferes with your daily activities.   You lose urine with sexual intercourse.   You have a chronic cough.   You have chronic constipation.  Document Released: 08/18/2002 Document Revised: 05/06/2011 Document Reviewed: 02/26/2009  [employer] Patient Information 2013 [employer].

## 2012-08-21 ENCOUNTER — Telehealth: Payer: Self-pay | Admitting: *Deleted

## 2012-08-21 DIAGNOSIS — N812 Incomplete uterovaginal prolapse: Secondary | ICD-10-CM

## 2012-08-21 DIAGNOSIS — N814 Uterovaginal prolapse, unspecified: Secondary | ICD-10-CM

## 2012-08-21 MED ORDER — ACETIC ACID-OXYQUINOLINE 0.9-0.025 % VA GEL: 1.0000 | VAGINAL | Status: DC

## 2012-08-21 NOTE — Telephone Encounter (Signed)
Pt called requesting that the gel she needs for her pessary be called in. She wasn't able to get it when Dr. Erin Fulling called it in before and now the pharmacy says she needs new rx. Rx sent to pharmacy per existing order in epic.

## 2012-12-07 ENCOUNTER — Ambulatory Visit (INDEPENDENT_AMBULATORY_CARE_PROVIDER_SITE_OTHER): Payer: BC Managed Care – HMO | Admitting: Obstetrics & Gynecology

## 2012-12-07 ENCOUNTER — Encounter: Payer: Self-pay | Admitting: Obstetrics & Gynecology

## 2012-12-07 VITALS — BP 138/74 | HR 81 | Temp 98.0°F | Ht 62.0 in | Wt 174.2 lb

## 2012-12-07 DIAGNOSIS — N318 Other neuromuscular dysfunction of bladder: Secondary | ICD-10-CM

## 2012-12-07 DIAGNOSIS — N814 Uterovaginal prolapse, unspecified: Secondary | ICD-10-CM

## 2012-12-07 DIAGNOSIS — N3281 Overactive bladder: Secondary | ICD-10-CM

## 2012-12-07 MED ORDER — MIRABEGRON ER 25 MG PO TB24: 25.0000 mg | ORAL_TABLET | Freq: Every day | ORAL | Status: DC

## 2012-12-07 NOTE — Patient Instructions (Signed)
Overactive Bladder, Adult The bladder has two functions that are totally opposite of the other. One is to relax and stretch out so it can store urine (fills like a balloon), and the other is to contract and squeeze down so that it can empty the urine that it has stored. Proper functioning of the bladder is a complex mixing of these two functions. The filling and emptying of the bladder can be influenced by:  The bladder.  The spinal cord.  The brain.  The nerves going to the bladder.  Other organs that are closely related to the bladder such as prostate in males and the vagina in females. As your bladder fills with urine, nerve signals are sent from the bladder to the brain to tell you that you may need to urinate. Normal urination requires that the bladder squeeze down with sufficient strength to empty the bladder, but this also requires that the bladder squeeze down sufficiently long to finish the job. In addition the sphincter muscles, which normally keep you from leaking urine, must also relax so that the urine can pass. Coordination between the bladder muscle squeezing down and the sphincter muscles relaxing is required to make everything happen normally. With an overactive bladder sometimes the muscles of the bladder contract unexpectedly and involuntarily and this causes an urgent need to urinate. The normal response is to try to hold urine in by contracting the sphincter muscles. Sometimes the bladder contracts so strongly that the sphincter muscles cannot stop the urine from passing out and incontinence occurs. This kind of incontinence is called urge incontinence. Having an overactive bladder can be embarrassing and awkward. It can keep you from living life the way you want to. Many people think it is just something you have to put up with as you grow older or have certain health conditions. In fact, there are treatments that can help make your life easier and more pleasant. CAUSES  Many  things can cause an overactive bladder. Possibilities include:  Urinary tract infection or infection of nearby tissues such as the prostate.  Prostate enlargement.  In women, multiple pregnancies or surgery on the uterus or urethra.  Bladder stones, inflammation or tumors.  Caffeine.  Alcohol.  Medications. For example, diuretics (drugs that help the body get rid of extra fluid) increase urine production. Some other medicines must be taken with lots of fluids.  Muscle or nerve weakness. This might be the result of a spinal cord injury, a stroke, multiple sclerosis or Parkinson's disease.  Diabetes can cause a high urine volume which fills the bladder so quickly that the normal urge to urinate is triggered very strongly. SYMPTOMS   Loss of bladder control. You feel the need to urinate and cannot make your body wait.  Sudden, strong urges to urinate.  Urinating 8 or more times a day.  Waking up to urinate two or more times a night. DIAGNOSIS  To decide if you have overactive bladder, your healthcare provider will probably:  Ask about symptoms you have noticed.  Ask about your overall health. This will include questions about any medications you are taking.  Do a physical examination. This will help determine if there are obvious blockages or other problems.  Order some tests. These might include:  A blood test to check for diabetes or other health issues that could be contributing to the problem.  Urine testing. This could measure the flow of urine and the pressure on the bladder.  A test of your neurological   system (the brain, spinal cord and nerves). This is the system that senses the need to urinate. Some of these tests are called flow tests, bladder pressure tests and electrical measurements of the sphincter muscle.  A bladder test to check whether it is emptying completely when you urinate.  Cytoscopy. This test uses a thin tube with a tiny camera on it. It offers a  look inside your urethra and bladder to see if there are problems.  Imaging tests. You might be given a contrast dye and then asked to urinate. X-rays are taken to see how your bladder is working. TREATMENT  An overactive bladder can be treated in many ways. The treatment will depend on the cause. Whether you have a mild or severe case also makes a difference. Often, treatment can be given in your healthcare provider's office or clinic. Be sure to discuss the different options with your caregiver. They include:  Behavioral treatments. These do not involve medication or surgery:  Bladder training. For this, you would follow a schedule to urinate at regular intervals. This helps you learn to control the urge to urinate. At first, you might be asked to wait a few minutes after feeling the urge. In time, you should be able to schedule bathroom visits an hour or more apart.  Kegel exercises. These exercises strengthen the pelvic floor muscles, which support the bladder. By toning these muscles, they can help control urination, even if the bladder muscles are overactive. A specialist will teach you how to do these exercises correctly. They will require daily practice.  Weight loss. If you are obese or overweight, losing weight might stop your bladder from being overactive. Talk to your healthcare provider about how many pounds you should lose. Also ask if there is a specific program or method that would work best for you.  Diet change. This might be suggested if constipation is making your overactive bladder worse. Your healthcare provider or a nutritionist can explain ways to change what you eat to ease constipation. Other people might need to take in less caffeine or alcohol. Sometimes drinking fewer fluids is needed, too.  Protection. This is not an actual treatment. But, you could wear special pads to take care of any leakage while you wait for other treatments to take effect. This will help you avoid  embarrassment.  Physical treatments.  Electrical stimulation. Electrodes will send gentle pulses to the nerves or muscles that help control the bladder. The goal is to strengthen them. Sometimes this is done with the electrodes outside of the body. Or, they might be placed inside the body (implanted). This treatment can take several months to have an effect.  Medications. These are usually used along with other treatments. Several medicines are available. Some are injected into the muscles involved in urination. Others come in pill form. Medications sometimes prescribed include:  Anticholinergics. These drugs block the signals that the nerves deliver to the bladder. This keeps it from releasing urine at the wrong time. Researchers think the drugs might help in other ways, too.  Imipramine. This is an antidepressant. But, it relaxes bladder muscles.  Botox. This is still experimental. Some people believe that injecting it into the bladder muscles will relax them so they work more normally. It has also been injected into the sphincter muscle when the sphincter muscle does not open properly. This is a temporary fix, however. Also, it might make matters worse, especially in older people.  Surgery.  A device might be implanted  to help manage your nerves. It works on the nerves that signal when you need to urinate.  Surgery is sometimes needed with electrical stimulation. If the electrodes are implanted, this is done through surgery.  Sometimes repairs need to be made through surgery. For example, the size of the bladder can be changed. This is usually done in severe cases only. HOME CARE INSTRUCTIONS   Take any medications your healthcare provider prescribed or suggested. Follow the directions carefully.  Practice any lifestyle changes that are recommended. These might include:  Drinking less fluid or drinking at different times of the day. If you need to urinate often during the night, for  example, you may need to stop drinking fluids early in the evening.  Cutting down on caffeine or alcohol. They can both make an overactive bladder worse. Caffeine is found in coffee, tea and sodas.  Doing Kegel exercises to strengthen muscles.  Losing weight, if that is recommended.  Eating a healthy and balanced diet. This will help you avoid constipation.  Keep a journal or a log. You might be asked to record how much you drink and when, and also when you feel the need to urinate.  Learn how to care for implants or other devices, such as pessaries. SEEK MEDICAL CARE IF:   Your overactive bladder gets worse.  You feel increased pain or irritation when you urinate.  You notice blood in your urine.  You have questions about any medications or devices that your healthcare provider recommended.  You notice blood, pus or swelling at the site of any test or treatment procedure.  You have an oral temperature above 102 F (38.9 C). SEEK IMMEDIATE MEDICAL CARE IF:  You have an oral temperature above 102 F (38.9 C), not controlled by medicine. Document Released: 12/08/2008 Document Revised: 05/06/2011 Document Reviewed: 12/08/2008 [facility] Patient Information 2014 [employer] [employer], [place].  [place] Jelly for pessary

## 2012-12-07 NOTE — Progress Notes (Signed)
Patient ID: Brenda Mccullough, female   DOB: April 13, 1945, 67 y.o.   MRN: [id number] HPI Pt has pessary. She feels that it is working well. She changes it every night because she does not like to sleep in it. She denies leakage of urine but, does c/o frequent urination.  She reports that she may have occ leakage that is not a problem but, she wants the freq controlled.  Review of Systems  Objective:   Physical Exam: BP 138/74  Pulse 81  Temp(Src) 98 F (36.7 C)  Ht 5\' 2"  (1.575 m)  Wt 174 lb 3.2 oz (79.017 kg)  BMI 31.85 kg/m2   Pt in NAD  WN:UUVOZDG removed  EGBUS: no lesions  Vagina: no blood in vault; tissue well healed  Cervix: no lesion; no mucopurulent d/c  Uterus: small, mobile; prolapsed  Pessary cleaned and replaced  Assessment:   Cystocele with uterine prolapse. Pt satisfied with pessary. Does not want surgery at present.  Overactive bladder- discuss with pt pharmacologic tx. Reviewed potential side effects   Plan:   F/u 4 weeks or sooner prn Myrbetriq 25mg  q day [place] jelly with change of pessary   Cristoval Teall L. Harraway-Smith, M.D., [place]

## 2013-05-12 ENCOUNTER — Encounter: Payer: Self-pay | Admitting: Obstetrics & Gynecology

## 2013-05-12 ENCOUNTER — Ambulatory Visit (INDEPENDENT_AMBULATORY_CARE_PROVIDER_SITE_OTHER): Payer: Medicare Other | Admitting: Obstetrics & Gynecology

## 2013-05-12 ENCOUNTER — Other Ambulatory Visit (HOSPITAL_COMMUNITY)
Admission: RE | Admit: 2013-05-12 | Discharge: 2013-05-12 | Disposition: A | Discharge status: Home / Self Care | Payer: Medicare Other | Source: Ambulatory Visit | Attending: Obstetrics & Gynecology | Admitting: Obstetrics & Gynecology

## 2013-05-12 VITALS — BP 138/77 | HR 82 | Temp 98.2°F | Ht 62.0 in | Wt 174.4 lb

## 2013-05-12 DIAGNOSIS — Z01419 Encounter for gynecological examination (general) (routine) without abnormal findings: Secondary | ICD-10-CM

## 2013-05-12 DIAGNOSIS — Z3480 Encounter for supervision of other normal pregnancy, unspecified trimester: Secondary | ICD-10-CM | POA: Insufficient documentation

## 2013-05-12 DIAGNOSIS — Z124 Encounter for screening for malignant neoplasm of cervix: Secondary | ICD-10-CM | POA: Insufficient documentation

## 2013-05-12 DIAGNOSIS — Z1151 Encounter for screening for human papillomavirus (HPV): Secondary | ICD-10-CM | POA: Insufficient documentation

## 2013-05-12 DIAGNOSIS — N819 Female genital prolapse, unspecified: Secondary | ICD-10-CM

## 2013-05-12 NOTE — Progress Notes (Signed)
Patient ID: Brenda Mccullough, female   DOB: Sep 21, 1945, 68 y.o.   MRN: [id number] Subjective:     Brenda Mccullough is a 68 y.o. female here for a routine exam.  Current complaints: feels pressure in the vagina at times.  Would like to consider new pessary type or size.  Not sexually active >10 years.   Gynecologic History No LMP recorded. Patient is postmenopausal. Contraception: post menopausal status Last Pap: 2013. Results were: normal Last mammogram: 04/2013. Results were: normal  Obstetric History OB History  Gravida Para Term Preterm AB SAB TAB Ectopic Multiple Living  3 3 3       3     # Outcome Date GA Lbr Len/2nd Weight Sex Delivery Anes PTL Lv  3 TRM 06/24/79   6 lb (2.722 kg) F SVD   Y  2 TRM 02/15/71 [id number]  6 lb (2.722 kg) F SVD   Y  1 TRM 07/19/68 [id number]  5 lb 12 oz (2.608 kg) F SVD   Y       The following portions of the patient's history were reviewed and updated as appropriate: allergies, current medications, past family history, past medical history, past social history, past surgical history and problem list.  Review of Systems A comprehensive review of systems was negative.    Objective:    BP 138/77  Pulse 82  Temp(Src) 98.2 F (36.8 C) (Oral)  Ht 5\' 2"  (1.575 m)  Wt 174 lb 6.4 oz (79.107 kg)  BMI 31.89 kg/m2  General Appearance:    Alert, cooperative, no distress, appears stated age                 Neck:   Supple, symmetrical, trachea midline, no adenopathy;    thyroid:  no enlargement/tenderness/nodules; no carotid   bruit or JVD  Back:     Symmetric, no curvature, ROM normal, no CVA tenderness  Lungs:     Clear to auscultation bilaterally, respirations unlabored  Chest Wall:    No tenderness or deformity   Heart:    Regular rate and rhythm, S1 and S2 normal, no murmur, rub   or gallop  Breast Exam:    No tenderness, masses, or nipple abnormality  Abdomen:     Soft, non-tender, bowel sounds active all four quadrants,    no masses, no organomegaly   Genitalia:    Normal female without lesion, discharge or tenderness- uterine prolapse pessary removed and cleaned. Flat pessary removed and changed to a size 3 inch donut pessary. (pt likes the feel but, is unable to remove the donut)      Extremities:   Extremities normal, atraumatic, no cyanosis or edema  Pulses:   2+ and symmetric all extremities  Skin:   Skin color, texture, turgor normal, no rashes or lesions            Assessment:    Healthy female exam.  Pelvic prolapse- new 3in donut pessary placed-  Pt unable to remove but will wear for 1 month and f/u. Flat pessary cleaned and returned to patient.    Plan:    Follow up in: 4 weeks.   F/u PAP with +HR HPV

## 2013-05-12 NOTE — Patient Instructions (Signed)
Pt given prolapse handout

## 2013-06-09 ENCOUNTER — Ambulatory Visit (INDEPENDENT_AMBULATORY_CARE_PROVIDER_SITE_OTHER): Payer: Medicare Other | Admitting: Obstetrics & Gynecology

## 2013-06-09 ENCOUNTER — Encounter: Payer: Self-pay | Admitting: Obstetrics & Gynecology

## 2013-06-09 VITALS — BP 139/65 | HR 72 | Temp 98.5°F | Resp 20 | Ht 63.0 in | Wt 172.7 lb

## 2013-06-09 DIAGNOSIS — N814 Uterovaginal prolapse, unspecified: Secondary | ICD-10-CM

## 2013-06-09 NOTE — Patient Instructions (Signed)
F/u every 3 months to remove and clean pessary

## 2013-06-09 NOTE — Progress Notes (Signed)
Subjective:     Patient ID: Brenda Mccullough, female   DOB: 1946-02-26, 68 y.o.   MRN: [id number]  HPI Pt presents today to have pessary removed and cleaned.  She denies problems and reports that her pessary is working very well.  She denies further complaints.     Review of Systems     Objective:   Physical Exam BP 139/65  Pulse 72  Temp(Src) 98.5 F (36.9 C) (Oral)  Resp 20  Ht 5\' 3"  (1.6 m)  Wt 172 lb 11.2 oz (78.336 kg)  BMI 30.60 kg/m2  Pt in NAD GYN: Pessary removed and cleaned; vaginal mucosa looks healthy and with no excoriations or breakdown       Assessment:     Pessary check     Plan:     F/u in 3 months to have pessary removed and cleaned F/u sooner prn

## 2013-09-20 ENCOUNTER — Ambulatory Visit (INDEPENDENT_AMBULATORY_CARE_PROVIDER_SITE_OTHER): Payer: Medicare Other | Admitting: Obstetrics & Gynecology

## 2013-09-20 ENCOUNTER — Encounter: Payer: Self-pay | Admitting: Obstetrics & Gynecology

## 2013-09-20 VITALS — BP 155/66 | HR 91 | Temp 98.3°F | Ht 64.0 in | Wt 169.7 lb

## 2013-09-20 DIAGNOSIS — N814 Uterovaginal prolapse, unspecified: Secondary | ICD-10-CM

## 2013-09-20 NOTE — Progress Notes (Signed)
Patient ID: Brenda Mccullough, female   DOB: Aug 27, 1945, 68 y.o.   MRN: [id number] Patient ID: Brenda McDorothy M Tassin, female DOB: Aug 27, 1945, 68 y.o. MRN: [id number]  HPI Pt presents today to have pessary removed and cleaned. She denies problems and reports that her pessary is working very well. She denies further complaints.  She is much happier with the larger pessary size.  Review of Systems  Objective:   Physical Exam BP 155/66  Pulse 91  Temp(Src) 98.3 F (36.8 C) (Oral)  Ht 5\' 4"  (1.626 m)  Wt 169 lb 11.2 oz (76.975 kg)  BMI 29.11 kg/m2   Pt in NAD  GYN: Pessary removed and cleaned; vaginal mucosa looks healthy and with no excoriations or breakdown  Assessment:   Pessary check  Plan:   F/u in 3 months to have pessary removed and cleaned  F/u sooner prn

## 2013-09-20 NOTE — Patient Instructions (Signed)
PAP normal with neg HPV 04/2013 Next PAP due 04/2016  Patient: Brenda Mccullough, Brenda Mccullough: 05/12/2013 [facility] Accession: [id number] Received: 05/12/2013 Willodean Rosenthalarolyn Harraway-Smith, MD DOB: August 22, 1945 Age: [age] Gender: F Reported: 05/14/2013 [address] Patient Ph: [phone] MRN#: [id number] [place] [address] Client Acc#: Chart: Phone: [phone] Fax: LMP: Visit#: [id number] CC: GYNECOLOGIC CYTOLOGY REPORT Adequacy Reason Satisfactory for evaluation, endocervical/transformation zone component PRESENT. Diagnosis NEGATIVE FOR INTRAEPITHELIAL LESIONS OR MALIGNANCY.

## 2013-12-27 ENCOUNTER — Encounter: Payer: Self-pay | Admitting: Obstetrics & Gynecology

## 2013-12-31 ENCOUNTER — Encounter: Payer: Self-pay | Admitting: Obstetrics & Gynecology

## 2013-12-31 ENCOUNTER — Ambulatory Visit (INDEPENDENT_AMBULATORY_CARE_PROVIDER_SITE_OTHER): Payer: Medicare Other | Admitting: Obstetrics & Gynecology

## 2013-12-31 VITALS — BP 154/65 | HR 76 | Temp 98.0°F | Ht 66.0 in | Wt 169.0 lb

## 2013-12-31 DIAGNOSIS — Z01419 Encounter for gynecological examination (general) (routine) without abnormal findings: Secondary | ICD-10-CM

## 2013-12-31 DIAGNOSIS — N814 Uterovaginal prolapse, unspecified: Secondary | ICD-10-CM

## 2013-12-31 NOTE — Patient Instructions (Signed)
Mammography Mammography is an X-ray of the breasts to look for changes that are not normal. The X-ray image is called a mammogram. This procedure can screen for breast cancer, can detect cancer early, and can diagnose cancer.  LET YOUR CAREGIVER KNOW ABOUT:  Breast implants.  Previous breast disease, biopsy, or surgery.  If you are breastfeeding.  Medicines taken, including vitamins, herbs, eyedrops, over-the-counter medicines, and creams.  Use of steroids (by mouth or creams).  Possibility of pregnancy, if this applies. RISKS AND COMPLICATIONS  Exposure to radiation, but at very low levels.  The results may be misinterpreted.  The results may not be accurate.  Mammography may lead to further tests.  Mammography may not catch certain cancers. BEFORE THE PROCEDURE  Schedule your test about 7 days after your menstrual period. This is when your breasts are the least tender and have signs of hormone changes.  If you have had a mammography done at a different facility in the past, get the mammogram X-rays or have them sent to your current exam facility in order to compare them.  Wash your breasts and under your arms the day of the test.  Do not wear deodorants, perfumes, or powders anywhere on your body.  Wear clothes that you can change in and out of easily. PROCEDURE Relax as much as possible during the test. Any discomfort during the test will be very brief. The test should take less than 30 minutes. The following will happen:  You will undress from the waist up and put on a gown.  You will stand in front of the X-ray machine.  Each breast will be placed between 2 plastic or glass plates. The plates will compress your breast for a few seconds.  X-rays will be taken from different angles of the breast. AFTER THE PROCEDURE  The mammogram will be examined.  Depending on the quality of the images, you may need to repeat certain parts of the test.  Ask when your test  results will be ready. Make sure you get your test results.  You may resume normal activities. Document Released: 02/09/2000 Document Revised: 05/06/2011 Document Reviewed: 12/02/2010 [employer] Patient Information 2015 [employer]. This information is not intended to replace advice given to you by your health care provider. Make sure you discuss any questions you have with your health care provider.  

## 2013-12-31 NOTE — Progress Notes (Signed)
Subjective:     Patient ID: Brenda Mccullough, female   DOB: 1945/08/31, 68 y.o.   MRN: [id number]  HPI Pt with pessary for pessary removal and cleaning.  Pt with no complaints at present.  She has had a URI which is improving.  Her last PAP was Mar 2015.    Review of Systems     Objective:   Physical Exam BP 154/65 mmHg  Pulse 76  Temp(Src) 98 F (36.7 C) (Oral)  Ht 5\' 6"  (1.676 m)  Wt 169 lb (76.658 kg)  BMI 27.29 kg/m2 Pt in NAD GU: EGBUS: no lesions Vagina: no blood in vault; copious physiologic discharge from pessary Pessary removed and cleaned and replaced without difficulty     Assessment:     Uterine prolapse     Plan:     Mammogram due Feb 2016  F/u 3 months or sooner prn Next PAP due in 2018 or sooner prn

## 2014-04-22 ENCOUNTER — Ambulatory Visit (INDEPENDENT_AMBULATORY_CARE_PROVIDER_SITE_OTHER): Payer: Self-pay | Admitting: Obstetrics & Gynecology

## 2014-04-22 ENCOUNTER — Encounter: Payer: Self-pay | Admitting: Obstetrics & Gynecology

## 2014-04-22 VITALS — BP 139/57 | HR 92 | Temp 98.5°F | Ht 62.0 in | Wt 170.5 lb

## 2014-04-22 DIAGNOSIS — N952 Postmenopausal atrophic vaginitis: Secondary | ICD-10-CM

## 2014-04-22 MED ORDER — ESTRADIOL 0.1 MG/GM VA CREA: 2.0000 g | TOPICAL_CREAM | Freq: Every day | VAGINAL | Status: DC

## 2014-04-22 NOTE — Progress Notes (Signed)
Patient ID: Brenda Mccullough, female   DOB: Sep 11, 1945, 69 y.o.   MRN: [id number] HPI Pt with pessary for pessary removal and cleaning. Pt with no complaints at present. She had injections of her knees today and feels less knee pain already.  She reports pain with insertion and removal opf the pessary. Her last PAP was Mar 2015.   Review of Systems     Objective:   Physical Exam BP 139/57 mmHg  Pulse 92  Temp(Src) 98.5 F (36.9 C)  Ht 5\' 2"  (1.575 m)  Wt 170 lb 8 oz (77.338 kg)  BMI 31.18 kg/m2  Pt in NAD GU: EGBUS: no lesions Vagina: no blood in vault; copious physiologic discharge from pessary.  Atrophic vaginal tissues.   Pessary removed and cleaned and replaced without difficulty     Assessment:     Uterine prolapse/ vaginal atrophy     Plan:     Estrace cream 1/2 applicatorful 2x/week F/u 3 months or sooner prn Next PAP due in 2018 or sooner prn

## 2014-04-25 ENCOUNTER — Telehealth: Payer: Self-pay | Admitting: *Deleted

## 2014-04-25 NOTE — Telephone Encounter (Signed)
Spoke with Dr. Erin FullingHarraway Smith about rx. She asked that we check with pharmacy to see if there is another estrogen cream that they cover like Premarin. Also noticed that patient is listed as self pay in our system, called patient and asked her to call us back. If she is self pay we may need to do compounded estradiol at [facility]. Will await patients call back to inquire about her insurance coverage so that we will know how to proceed.

## 2014-04-25 NOTE — Telephone Encounter (Signed)
Patient called and stated that the rx that Dr. Derwood KaplanH-S sent in for her is too expensive. Cost is $89, and she needs something more affordable. Dr. Derwood KaplanH-S in clinic this afternoon will check with her at that time and call patient back.

## 2014-04-26 NOTE — Telephone Encounter (Signed)
Patient returned call and stated she can be reached at [phone] (workplace). Called patient and she states she has [employer] Complete Plan through Medicare. Informed patient I would call pharmacy and discuss other possible options. Called pharmacy and they informed me patient would need to call her insurance company and that they would not know how much her insurance will cover; added that premarin and estradiol are expensive.  Called [facility] regarding cost of compounded estradiol .2/.02% cream 30g and they informed me it will cost $56. Called patient and informed her of this price before her insurance. Patient verbalized understanding and asked that it be called it. Called RX in to [facility]. 0.2/.02% Estradiol Vaginal Cream Dispense 30g with no refills. Gave them patient's insurance as well. Patient to be called when RX is ready for pick up.

## 2014-04-27 LAB — HM MAMMOGRAPHY

## 2014-05-30 ENCOUNTER — Telehealth: Payer: Self-pay | Admitting: *Deleted

## 2014-05-30 NOTE — Telephone Encounter (Signed)
Brenda Mccullough called and left a message that she is a patient of Dr. Erin FullingHarraway-Smith and she uses a pessary. States she got the cream the doctor told her to get; but now she is itching " real bad" and now bleeding some. States she is at work and can be reached there until 5pm today , otherwise call home.

## 2014-05-30 NOTE — Telephone Encounter (Signed)
Pt contacted the clinic complaining of vaginal itching, burning and bleeding since start of use of hormonal cream.  Appointment made for patient for 06/01/14 @ 1345 with Dr. Erin FullingHarraway Smith, pt verbalizes understanding.

## 2014-05-30 NOTE — Telephone Encounter (Signed)
Telephone call resolution documented in different encounter

## 2014-06-01 ENCOUNTER — Encounter: Payer: Self-pay | Admitting: Obstetrics & Gynecology

## 2014-06-01 ENCOUNTER — Ambulatory Visit (INDEPENDENT_AMBULATORY_CARE_PROVIDER_SITE_OTHER): Payer: Medicare Other | Admitting: Obstetrics & Gynecology

## 2014-06-01 VITALS — BP 148/57 | HR 81 | Temp 98.2°F | Ht 61.0 in | Wt 171.9 lb

## 2014-06-01 DIAGNOSIS — N898 Other specified noninflammatory disorders of vagina: Secondary | ICD-10-CM

## 2014-06-01 NOTE — Progress Notes (Signed)
Subjective:     Patient ID: Brenda Mccullough, female   DOB: 01-01-1946, 69 y.o.   MRN: [id number]  HPI Pt reports that she has been using her topical EES but, she has recently developed an excessive amount of discharge and itching.  She used an OTC antifungal and then noted bleeding on her pad and is here for eval.  She reports that the blood was more pink.  She denies pain assoc.  The itching has improved with only 1 dos of the med which she stopped after she noted the blood.  She kept the pessary in as she is unable to remove it   Review of Systems     Objective:   Physical Exam BP 148/57 mmHg  Pulse 81  Temp(Src) 98.2 F (36.8 C) (Oral)  Ht 5\' 1"  (1.549 m)  Wt 171 lb 14.4 oz (77.973 kg)  BMI 32.50 kg/m2  LMP 05/30/2014 Pt in NAD Pessary removed GU: EGBUS: no lesions Vagina: copious amounts of discharge noted in vagina and coming out of vagina.  It is yellow with tinges of blood.  Bleeding noted from vaginal wall on upper right vaginal wall.   Cervix: no lesion; slightly friable       Assessment:     Post menopausal bleeding- suspect due to vaginal wall/cervical trauma.  Will allow trial of pessary out to allow healing and wait for wet smear/KOH       Plan:     Keep pessary out for now F/u wet smear and treat as appropriate Pt may elect to stay out of work until Tues due to prolapse and discomfort when pessary is out. Pt sent home with pessary in purse to bring back in 5 days

## 2014-06-02 ENCOUNTER — Other Ambulatory Visit: Payer: Self-pay | Admitting: Obstetrics & Gynecology

## 2014-06-02 DIAGNOSIS — N898 Other specified noninflammatory disorders of vagina: Secondary | ICD-10-CM

## 2014-06-02 LAB — WET PREP, GENITAL
CLUE CELLS WET PREP: NONE SEEN
Trich, Wet Prep: NONE SEEN
Yeast Wet Prep HPF POC: NONE SEEN

## 2014-06-02 MED ORDER — METRONIDAZOLE 500 MG PO TABS: 500.0000 mg | ORAL_TABLET | Freq: Two times a day (BID) | ORAL | Status: AC

## 2014-06-02 NOTE — Progress Notes (Signed)
Called patient, no answer- left message stating we are trying to reach you with results, nothing urgent but if you could call us back at the clinics

## 2014-06-02 NOTE — Progress Notes (Signed)
Patient called back and stated to call her at work with information. I called patient and informed her of results.

## 2014-06-06 ENCOUNTER — Encounter: Payer: Self-pay | Admitting: Obstetrics & Gynecology

## 2014-06-06 ENCOUNTER — Ambulatory Visit (INDEPENDENT_AMBULATORY_CARE_PROVIDER_SITE_OTHER): Payer: Medicare Other | Admitting: Obstetrics & Gynecology

## 2014-06-06 VITALS — BP 151/61 | HR 88 | Temp 97.9°F | Ht 60.0 in | Wt 169.9 lb

## 2014-06-06 DIAGNOSIS — N813 Complete uterovaginal prolapse: Secondary | ICD-10-CM | POA: Diagnosis not present

## 2014-06-06 DIAGNOSIS — N760 Acute vaginitis: Secondary | ICD-10-CM

## 2014-06-06 NOTE — Progress Notes (Signed)
Subjective:     Patient ID: Brenda Mccullough, female   DOB: 05-18-1945, 69 y.o.   MRN: [id number]  HPI Pt presents for f/u of vaginitiis.  She did recall now that she had an antibiotic for a sinus infxn that proceeded her itching and discharge.  Her sx have resolved but, she is miserable without her pessary in place.  She denies further vaginal bleeding.  She continues to take the EES cream and feels better now that she has started the cream.  She denies vaginal or pelvic pain except for the pulling that she has without the pessary in place..     Review of Systems     Objective:   Physical Exam BP 151/61 mmHg  Pulse 88  Temp(Src) 97.9 F (36.6 C) (Oral)  Ht 5' (1.524 m)  Wt 169 lb 14.4 oz (77.066 kg)  BMI 33.18 kg/m2  LMP 05/30/2014  GU: EGBUS: no lesions Vagina: no blood in vault; prev vaginal discharge completely resolved.  Cervix: no lesion; no mucopurulent d/; no erythema or discharge        Assessment:     Vulvovaginitis- resolved Uterine prolapse- pessary cleaned and replaced     Plan:     F/u in 3 months to clean and replace pessary Keep EES cream 2-3 times per week F/u sooner prn

## 2014-07-15 ENCOUNTER — Ambulatory Visit: Payer: Self-pay | Admitting: Obstetrics & Gynecology

## 2014-08-17 ENCOUNTER — Encounter: Payer: Self-pay | Admitting: *Deleted

## 2014-09-02 ENCOUNTER — Encounter: Payer: Self-pay | Admitting: Obstetrics & Gynecology

## 2014-09-02 ENCOUNTER — Ambulatory Visit (INDEPENDENT_AMBULATORY_CARE_PROVIDER_SITE_OTHER): Payer: Medicare Other | Admitting: Obstetrics & Gynecology

## 2014-09-02 VITALS — BP 144/72 | HR 79 | Ht 62.0 in | Wt 168.9 lb

## 2014-09-02 DIAGNOSIS — N819 Female genital prolapse, unspecified: Secondary | ICD-10-CM

## 2014-09-02 DIAGNOSIS — Z4689 Encounter for fitting and adjustment of other specified devices: Secondary | ICD-10-CM

## 2014-09-02 NOTE — Progress Notes (Signed)
Patient ID: Brenda Mccullough, female   DOB: 10-23-1945, 69 y.o.   MRN: [id number]  HPI Pt reports that she has been using her topical EES and reports that she has no discomfort from the pseeary.  She has normal discharge. She denies pain. She kept the pessary in as she is unable to remove it   Review of Systems: [place]     Objective:   Physical Exam BP 144/72 mmHg  Pulse 79  Ht 5\' 2"  (1.575 m)  Wt 168 lb 14.4 oz (76.613 kg)  BMI 30.88 kg/m2  LMP 05/30/2014  Pt in NAD Pessary removed GU: EGBUS: no lesions Vagina: skin intact.  No atrophy noted Cervix: no lesion       Assessment:     POP- pessary removed and cleaned.  Vagina healthy      Plan:  F/u in 3 months of pessary cleaning and replacement F/u sooner prn  Annick Dimaio L. Harraway-Smith, M.D., Evern CoreFACOG

## 2014-09-30 ENCOUNTER — Ambulatory Visit (INDEPENDENT_AMBULATORY_CARE_PROVIDER_SITE_OTHER): Payer: Medicare Other | Admitting: Obstetrics & Gynecology

## 2014-09-30 ENCOUNTER — Encounter: Payer: Self-pay | Admitting: Obstetrics & Gynecology

## 2014-09-30 DIAGNOSIS — N390 Urinary tract infection, site not specified: Secondary | ICD-10-CM | POA: Diagnosis not present

## 2014-09-30 LAB — POCT URINALYSIS DIP (DEVICE)
Bilirubin Urine: NEGATIVE
Glucose, UA: NEGATIVE mg/dL
Ketones, ur: NEGATIVE mg/dL
Nitrite: NEGATIVE
PROTEIN: NEGATIVE mg/dL
SPECIFIC GRAVITY, URINE: 1.015 (ref 1.005–1.030)
Urobilinogen, UA: 0.2 mg/dL (ref 0.0–1.0)
pH: 7 (ref 5.0–8.0)

## 2014-09-30 MED ORDER — NITROFURANTOIN MONOHYD MACRO 100 MG PO CAPS: 100.0000 mg | ORAL_CAPSULE | Freq: Two times a day (BID) | ORAL | Status: DC

## 2014-09-30 NOTE — Progress Notes (Signed)
Patient ID: Brenda Mccullough, female   DOB: [birth year], 69 y.o.   MRN: [id number] Pt with slight burning and dysuria.  +leuk and blood on UA.  Pt denies other sx. Exam deferred  UTI  macrobid 100bg bid x3 days Urine cx to be sent  Abenezer Odonell L. Harraway-Smith, M.D., [facility] Core

## 2014-09-30 NOTE — Progress Notes (Signed)
Pt came to clinic complaining of burning and frequency. UA obtained and was reviewed by Dr. Erin Fulling. Macrobid ordered for patient and urine to be sent for culture.

## 2014-10-03 LAB — URINE CULTURE

## 2014-11-21 ENCOUNTER — Telehealth: Payer: Self-pay | Admitting: General Practice

## 2014-11-21 NOTE — Telephone Encounter (Signed)
Patient called and left message requesting a medication for a UTI because she thinks she has another one

## 2014-11-22 ENCOUNTER — Telehealth: Payer: Self-pay | Admitting: *Deleted

## 2014-11-22 NOTE — Telephone Encounter (Signed)
Received message left on nurse line 11/22/14 at 1317.  Patient states she is a patient of Dr. Erin Fulling.  States she has a pessary and has an appointment on Friday with the doctor but today has started bleeding like she is having a period.  Attempted to call patient at work and at home.  Unable to reach patient.  Noticed patient has an appointment tomorrow at 2:30 pm.  Looks like the patient called into the registration staff before I called her and moved her appointment from Friday to tomorrow.

## 2014-11-22 NOTE — Telephone Encounter (Signed)
Pt called and stated she is having a lot of irritation and some bleeding. Patients appt moved to tomorrow.

## 2014-11-23 ENCOUNTER — Other Ambulatory Visit (HOSPITAL_COMMUNITY)
Admission: RE | Admit: 2014-11-23 | Discharge: 2014-11-23 | Disposition: A | Discharge status: Home / Self Care | Payer: Medicare Other | Source: Ambulatory Visit | Attending: Obstetrics & Gynecology | Admitting: Obstetrics & Gynecology

## 2014-11-23 ENCOUNTER — Encounter: Payer: Self-pay | Admitting: Obstetrics & Gynecology

## 2014-11-23 ENCOUNTER — Ambulatory Visit (INDEPENDENT_AMBULATORY_CARE_PROVIDER_SITE_OTHER): Payer: Medicare Other | Admitting: Obstetrics & Gynecology

## 2014-11-23 VITALS — BP 147/62 | HR 92 | Temp 98.0°F | Ht 61.0 in | Wt 166.5 lb

## 2014-11-23 DIAGNOSIS — N390 Urinary tract infection, site not specified: Secondary | ICD-10-CM | POA: Diagnosis not present

## 2014-11-23 DIAGNOSIS — N814 Uterovaginal prolapse, unspecified: Secondary | ICD-10-CM | POA: Diagnosis not present

## 2014-11-23 DIAGNOSIS — N950 Postmenopausal bleeding: Secondary | ICD-10-CM | POA: Insufficient documentation

## 2014-11-23 DIAGNOSIS — R350 Frequency of micturition: Secondary | ICD-10-CM | POA: Diagnosis not present

## 2014-11-23 DIAGNOSIS — Z780 Asymptomatic menopausal state: Secondary | ICD-10-CM | POA: Diagnosis not present

## 2014-11-23 LAB — POCT URINALYSIS DIP (DEVICE)
BILIRUBIN URINE: NEGATIVE
Glucose, UA: NEGATIVE mg/dL
Ketones, ur: NEGATIVE mg/dL
NITRITE: NEGATIVE
PH: 7 (ref 5.0–8.0)
PROTEIN: NEGATIVE mg/dL
Specific Gravity, Urine: 1.015 (ref 1.005–1.030)
Urobilinogen, UA: 0.2 mg/dL (ref 0.0–1.0)

## 2014-11-23 MED ORDER — CEPHALEXIN 500 MG PO CAPS: 500.0000 mg | ORAL_CAPSULE | Freq: Four times a day (QID) | ORAL | Status: DC

## 2014-11-23 NOTE — Progress Notes (Signed)
Patient ID: Brenda Mccullough, female   DOB: [birth year], 69 y.o.   MRN: [id number] History:  69 y.o. G3P3003 here today for PMPB.  She is not sure where the bleeding is coming from. She reports some dysuria.  She has the pessary in but, is not painful.     The following portions of the patient's history were reviewed and updated as appropriate: allergies, current medications, past family history, past medical history, past social history, past surgical history and problem list.  Review of Systems:  Pertinent items are noted in HPI.  Objective:  Physical Exam Blood pressure 147/62, pulse 92, temperature 98 F (36.7 C), temperature source Oral, height  (1.549 m), weight 166 lb 8 oz (75.524 kg), last menstrual period 05/30/2014. Gen: NAD Abd: Soft, nontender and nondistended Pelvic: Normal appearing external genitalia; vaginal mucosa slightly friable.  Cervix normal. No abnormal discharge.  Small uterus, no other palpable masses, no uterine or adnexal tenderness  The indications for endometrial biopsy were reviewed.   Risks of the biopsy including cramping, bleeding, infection, uterine perforation, inadequate specimen and need for additional procedures  were discussed. The patient states she understands and agrees to undergo procedure today. Consent was signed. Time out was performed. Urine HCG was negative. A sterile speculum was placed in the patient's vagina and the cervix was prepped with Betadine. A single-toothed tenaculum was placed on the anterior lip of the cervix to stabilize it. The 3 mm pipelle was introduced into the endometrial cavity without difficulty to a depth of 6cm, and a moderate amount of tissue was obtained and sent to pathology. The instruments were removed from the patient's vagina. Minimal bleeding from the cervix was noted. The patient tolerated the procedure well. Routine post-procedure instructions were given to the patient.      Assessment & Plan:  PMPB UTI sx- will  treat Keflex qid x 3 days Urine cx Keep pessary out and use the topical EES for 2 night F/u in 2 days to have pessary replaced The patient will follow up to review the results and for further management.  Carolyn L. Harraway-Smith, M.D., [facility] Core

## 2014-11-24 ENCOUNTER — Telehealth: Payer: Self-pay | Admitting: *Deleted

## 2014-11-24 DIAGNOSIS — N390 Urinary tract infection, site not specified: Secondary | ICD-10-CM

## 2014-11-24 LAB — URINE CULTURE

## 2014-11-24 MED ORDER — CEPHALEXIN 500 MG PO CAPS: 500.0000 mg | ORAL_CAPSULE | Freq: Four times a day (QID) | ORAL | Status: DC

## 2014-11-24 NOTE — Telephone Encounter (Signed)
Per chart review, medication went to [facility]. Med reordered and sent to [facility]. Called patient, no answer- left message stating we are calling you back and have received your message. The medication went to the wrong pharmacy and we apologize for the inconvenience. We have sent your medication to [facility], so it is there for you to go by and pick up now. Please call us back if you have other questions or concerns

## 2014-11-24 NOTE — Telephone Encounter (Signed)
Received message left on nurse line.  Patient states Dr. Erin Fulling was supposed to call in a prescription for UTI to [facility].  States she is at the drug store and they don't have the prescription.  Requests a return call.

## 2014-11-25 ENCOUNTER — Ambulatory Visit: Payer: Medicare Other | Admitting: Obstetrics & Gynecology

## 2014-11-25 ENCOUNTER — Encounter: Payer: Self-pay | Admitting: Obstetrics & Gynecology

## 2014-11-25 ENCOUNTER — Ambulatory Visit (INDEPENDENT_AMBULATORY_CARE_PROVIDER_SITE_OTHER): Payer: Medicare Other | Admitting: Obstetrics & Gynecology

## 2014-11-25 VITALS — BP 142/64 | HR 76 | Temp 97.7°F | Ht 61.0 in | Wt 164.8 lb

## 2014-11-25 DIAGNOSIS — Z23 Encounter for immunization: Secondary | ICD-10-CM | POA: Diagnosis not present

## 2014-11-25 DIAGNOSIS — N814 Uterovaginal prolapse, unspecified: Secondary | ICD-10-CM

## 2014-11-25 NOTE — Progress Notes (Signed)
Patient ID: Brenda Mccullough, female   DOB: 1945/08/01, 69 y.o.   MRN: [id number] History:  69 y.o. G3P3003 here today for reeval of PMPB and urinary sx.  Pessary removed 2 days prev.  Pt using Premarin and here to have pessary replaced.  She reports that the bleeding has resolved.   The following portions of the patient's history were reviewed and updated as appropriate: allergies, current medications, past family history, past medical history, past social history, past surgical history and problem list.  Review of Systems:  Pertinent items are noted in HPI.  Objective:  Physical Exam Blood pressure 142/64, pulse 76, temperature 97.7 F (36.5 C), temperature source Oral, height  (1.549 m), weight 164 lb 12.8 oz (74.753 kg), last menstrual period 05/30/2014. Gen: NAD Abd: Soft, nontender and nondistended Pelvic: Normal appearing external genitalia; normal appearing vaginal mucosa- no bleeding noted.  Normal discharge.  Small uterus, no other palpable masses, no uterine or adnexal tenderness.  Pessary replaced without difficulty  Urine cx:  >100K GBS   Assessment & Plan:  UTI Uterine prolapse- pessary replaced today  Keflex 500 bid x 7 days Pessary replaced Flu shot Keep premarin cream 2x/week Still awaiting results of endo bx  Carolyn L. Harraway-Smith, M.D., [facility] Core

## 2014-11-29 ENCOUNTER — Telehealth: Payer: Self-pay | Admitting: *Deleted

## 2014-11-29 NOTE — Telephone Encounter (Signed)
Per Dr. Erin Fulling call patient to inform her that endometrial biopsy is negative. Called patient and someone picked up and hung up the phone. Called back again and phone just rings. Will retry later.

## 2014-11-30 NOTE — Telephone Encounter (Signed)
Called patient, no answer- left message stating we are trying to reach you with non urgent results, please call us back at the clinics 

## 2014-12-01 NOTE — Telephone Encounter (Signed)
Brenda Mccullough called this am and stated she is returning our call from [place].  Called Brenda Mccullough and notified her Dr. Erin Fulling wanted [place] to call and let her know the endometrial biopsy results came back negative. Brenda Mccullough was appreciative of call and states she had been worried. No other concerns voiced.

## 2015-03-17 ENCOUNTER — Encounter: Payer: Self-pay | Admitting: Obstetrics & Gynecology

## 2015-03-17 ENCOUNTER — Ambulatory Visit (INDEPENDENT_AMBULATORY_CARE_PROVIDER_SITE_OTHER): Payer: Medicare Other | Admitting: Obstetrics & Gynecology

## 2015-03-17 VITALS — BP 156/66 | HR 77 | Temp 97.9°F | Resp 18 | Ht 62.0 in | Wt 163.0 lb

## 2015-03-17 DIAGNOSIS — N819 Female genital prolapse, unspecified: Secondary | ICD-10-CM | POA: Diagnosis not present

## 2015-03-17 NOTE — Progress Notes (Signed)
Patient ID: Brenda Mccullough, female   DOB: Jun 28, 1945, 70 y.o.   MRN: [id number] Patient ID: Brenda Mccullough, female DOB: Dec 19, 1945, 70 y.o. MRN: [id number] History:  70 y.o. G3P3003 here today for reeval of pelvic organ prolapse. she denies being seen since her last visit.   She noted a small amount of bleeding one day.  Did not removed her pessary. Pt still  using Premarin and here to have pessary replaced.   The following portions of the patient's history were reviewed and updated as appropriate: allergies, current medications, past family history, past medical history, past social history, past surgical history and problem list.  Review of Systems:  Pertinent items are noted in HPI.  Objective:  Physical Exam BP 156/66 mmHg  Pulse 77  Temp(Src) 97.9 F (36.6 C) (Oral)  Resp 18  Ht  (1.575 m)  Wt 163 lb (73.936 kg)  BMI 29.81 kg/m2  SpO2 100%  LMP  (LMP Unknown)  Gen: NAD Abd: Soft, nontender and nondistended Pelvic: Normal appearing external genitalia; normal appearing vaginal mucosa- small amount of  Blood noted.yellow discharge that is normal for her with pessary. Small uterus, no other palpable masses, no uterine or adnexal tenderness. Pessary replaced without difficulty    Assessment & Plan:  Uterine prolapse- pessary cleaned and replaced today   Pessary replaced  [name] L. Harraway-Smith, M.D., [facility] Core

## 2015-06-16 ENCOUNTER — Encounter: Payer: Self-pay | Admitting: Obstetrics & Gynecology

## 2015-06-16 ENCOUNTER — Ambulatory Visit (INDEPENDENT_AMBULATORY_CARE_PROVIDER_SITE_OTHER): Payer: Medicare Other | Admitting: Obstetrics & Gynecology

## 2015-06-16 VITALS — BP 139/61 | HR 79 | Wt 162.2 lb

## 2015-06-16 DIAGNOSIS — N819 Female genital prolapse, unspecified: Secondary | ICD-10-CM | POA: Diagnosis not present

## 2015-06-16 NOTE — Progress Notes (Signed)
Patient ID: Brenda Mccullough, female   DOB: 1945-05-17, 70 y.o.   MRN: [id number] HPI Pt reports that she has been using her topical EES 1-2  Times per week and reports that she has no discomfort from the pseeary. She has normal discharge.She denies pain. She keeps the pessary in.  She has it changed here every 3 months.  Last PAP 05/13/2013- WNL; neg hr HPV    Review of Systems: [place]    Objective:  BP 139/61 mmHg  Pulse 79  Wt 162 lb 3.2 oz (73.573 kg)  LMP  (LMP Unknown) Pt in NAD Pessary removed GU: EGBUS: no lesions Vagina: skin intact. No atrophy noted; Grade III prolapse of  Cervix: no lesion      Assessment:    POP- pessary removed and cleaned. Vagina healthy     Plan:  F/u in 3 months of pessary cleaning and replacement [facility] EES cream 2 times per week F/u sooner prn  Brenda Mccullough, M.D., Evern CoreFACOG

## 2015-09-11 ENCOUNTER — Ambulatory Visit (INDEPENDENT_AMBULATORY_CARE_PROVIDER_SITE_OTHER): Payer: Medicare Other | Admitting: Obstetrics & Gynecology

## 2015-09-11 ENCOUNTER — Ambulatory Visit: Payer: Medicare Other | Admitting: Obstetrics & Gynecology

## 2015-09-11 ENCOUNTER — Encounter: Payer: Self-pay | Admitting: Obstetrics & Gynecology

## 2015-09-11 VITALS — BP 139/57 | HR 79 | Ht 62.0 in | Wt 152.0 lb

## 2015-09-11 DIAGNOSIS — M25561 Pain in right knee: Secondary | ICD-10-CM | POA: Diagnosis not present

## 2015-09-11 DIAGNOSIS — M25562 Pain in left knee: Secondary | ICD-10-CM

## 2015-09-11 DIAGNOSIS — N812 Incomplete uterovaginal prolapse: Secondary | ICD-10-CM

## 2015-09-11 DIAGNOSIS — N898 Other specified noninflammatory disorders of vagina: Secondary | ICD-10-CM | POA: Diagnosis not present

## 2015-09-11 DIAGNOSIS — K219 Gastro-esophageal reflux disease without esophagitis: Secondary | ICD-10-CM

## 2015-09-11 MED ORDER — ESOMEPRAZOLE MAGNESIUM 40 MG PO CPDR: 40.0000 mg | DELAYED_RELEASE_CAPSULE | Freq: Every day | ORAL | Status: DC

## 2015-09-11 MED ORDER — METRONIDAZOLE 500 MG PO TABS: 500.0000 mg | ORAL_TABLET | Freq: Two times a day (BID) | ORAL | Status: AC

## 2015-09-11 MED ORDER — AMITRIPTYLINE HCL 25 MG PO TABS: 25.0000 mg | ORAL_TABLET | Freq: Every day | ORAL | Status: AC

## 2015-09-11 NOTE — Progress Notes (Signed)
Patient ID: Brenda Mccullough, female   DOB: [birth year], 70 y.o.   MRN: [id number] 70 y.o. G3P3003 here today for reeval of pelvic organ prolapse. she denies being seen since her last visit. She occ sees a small amount of blood. Pt still using Premarin and here to have pessary replaced.  Pt does not want to leave pessary out because she reports that she cannot work when its out.  The following portions of the patient's history were reviewed and updated as appropriate: allergies, current medications, past family history, past medical history, past social history, past surgical history and problem list.  Review of Systems:  Pertinent items are noted in HPI.  Objective:  Physical Exam BP 156/66 mmHg  Pulse 77  Temp(Src) 97.9 F (36.6 C) (Oral)  Resp 18  Ht 5\' 2"  (1.575 m)  Wt 163 lb (73.936 kg)  BMI 29.81 kg/m2  SpO2 100%  LMP (LMP Unknown)  Gen: NAD Abd: Soft, nontender and nondistended Pelvic: Normal appearing external genitalia; vaginal mucosa friable with copius amounts of discharge with a foul odor (more than her usual discharge). Pessary removed and cleaned and replaced without difficulty    Assessment & Plan:  Uterine prolapse- pessary cleaned and replaced today Discharge- wet mount obtained. Vaginal bleeding- will treat for BV and f/u in 1 month.  If still with friability will speak to pt again about leaving pessary out.  Refilled Nexium Refilled Amytryptyline 25 mg Flagyl 500mg  bid x 7 days  Pessary replaced  Brenda Mccullough L. Harraway-Smith, M.D., Evern CoreFACOG

## 2015-09-12 LAB — WET PREP BY MOLECULAR PROBE
CANDIDA SPECIES: NEGATIVE
GARDNERELLA VAGINALIS: NEGATIVE
TRICHOMONAS VAG: NEGATIVE

## 2015-10-11 ENCOUNTER — Encounter: Payer: Self-pay | Admitting: Obstetrics & Gynecology

## 2015-10-11 ENCOUNTER — Ambulatory Visit (INDEPENDENT_AMBULATORY_CARE_PROVIDER_SITE_OTHER): Payer: Medicare Other | Admitting: Obstetrics & Gynecology

## 2015-10-11 VITALS — BP 155/67 | HR 87 | Resp 16 | Ht 62.0 in | Wt 155.0 lb

## 2015-10-11 DIAGNOSIS — N898 Other specified noninflammatory disorders of vagina: Secondary | ICD-10-CM | POA: Diagnosis not present

## 2015-10-11 DIAGNOSIS — Z1239 Encounter for other screening for malignant neoplasm of breast: Secondary | ICD-10-CM

## 2015-10-11 NOTE — Progress Notes (Signed)
Patient ID: Brenda Mccullough, female   DOB: 09/07/1945, 70 y.o.   MRN: [id number] HPI Pt reports that she has been using her topical EES 1-2  Times per week and reports that she has no discomfort from the pseeary. She has normal discharge.She denies pain. She keeps the pessary in.  She has it changed here every 3 months.  She does have a small amount of bleeding after I remove the pessary.  Last PAP 05/13/2013- WNL; neg hr HPV    Review of Systems: [place]    Objective:  BP (!) 155/67   Pulse 87   Resp 16   Ht 5\' 2"  (1.575 m)   Wt 155 lb (70.3 kg)   LMP  (LMP Unknown)   BMI 28.35 kg/m   Pt in NAD Pessary removed GU: EGBUS: no lesions Vagina: skin intact. No atrophy noted; Grade III prolapse of  Cervix: no lesion   CLINICAL DATA:Screening. 04/28/2015  EXAM: DIGITAL SCREENING BILATERAL MAMMOGRAM WITH CAD  COMPARISON:Previous exam(s).  ACR Breast Density Category c: The breast tissue is heterogeneously dense, which may obscure small masses.  FINDINGS: There are no findings suspicious for malignancy. Images were processed with CAD.  IMPRESSION: No mammographic evidence of malignancy. A result letter of this screening mammogram will be mailed directly to the patient.  RECOMMENDATION: Screening mammogram in one year. (Code:SM-B-01Y)  BI-RADS CATEGORY1: Negative.    Assessment:    POP- pessary removed and cleaned. Vagina healthy     Plan:  F/u in 3 months of pessary cleaning and replacement Pt [facility] EES cream 2 times per week F/u sooner prn         Ketrick Matney L. Harraway-Smith, M.D., Evern CoreFACOG

## 2015-10-11 NOTE — Addendum Note (Signed)
Addended by: Granville LewisLARK, Taneah Masri L on: 10/11/2015 02:29 PM   Modules accepted: Orders

## 2015-10-12 ENCOUNTER — Telehealth: Payer: Self-pay | Admitting: *Deleted

## 2015-10-12 LAB — WET PREP BY MOLECULAR PROBE
Candida species: NEGATIVE
Gardnerella vaginalis: NEGATIVE
Trichomonas vaginosis: NEGATIVE

## 2015-10-12 NOTE — Telephone Encounter (Signed)
Pt notified of neg wet prep results. 

## 2015-12-28 ENCOUNTER — Ambulatory Visit (INDEPENDENT_AMBULATORY_CARE_PROVIDER_SITE_OTHER): Payer: Medicare Other | Admitting: Obstetrics & Gynecology

## 2015-12-28 ENCOUNTER — Encounter: Payer: Self-pay | Admitting: Obstetrics & Gynecology

## 2015-12-28 VITALS — BP 155/80 | HR 76 | Ht 62.0 in | Wt 156.0 lb

## 2015-12-28 DIAGNOSIS — N812 Incomplete uterovaginal prolapse: Secondary | ICD-10-CM

## 2015-12-28 DIAGNOSIS — I10 Essential (primary) hypertension: Secondary | ICD-10-CM | POA: Diagnosis not present

## 2015-12-28 NOTE — Progress Notes (Signed)
  HPI Pt presents today to have pessary removed and cleaned.  She denies problems and reports that her pessary is working very well.  She denies further complaints.  She continuess to use vaginal EES.  Review of Systems     Objective:   Physical Exam  BP (!) 155/80 (BP Location: Left Arm, Patient Position: Sitting)   Pulse 76   Ht 5\' 2"  (1.575 m)   Wt 156 lb (70.8 kg)   LMP  (LMP Unknown)   BMI 28.53 kg/m  CONSTITUTIONAL: Well-developed, well-nourished female in no acute distress.  HENT:  Normocephalic, atraumatic SKIN: Skin is warm and dry. No rash noted. Not diaphoretic. No erythema. No pallor. NEUROLGIC: Alert and oriented to person, place, and time.  GYN: Pessary removed and cleaned; vaginal mucosa looks healty and with no excoriations or breakdown     Assessment:     Pessary check  Elevated BP. Pt with chronic HTN. Pt has recently lost weight due to caloric restriction and was recently started on a statin to manage her Chol     Plan:     F/u in 3 months to have pessary removed and cleaned F/u sooner prn  Pt will f/u with primary care physician to have her BP rechecked.  Kaetlyn Noa L. Harraway-Smith, M.D., Evern CoreFACOG

## 2016-04-12 ENCOUNTER — Other Ambulatory Visit (HOSPITAL_COMMUNITY)
Admission: RE | Admit: 2016-04-12 | Discharge: 2016-04-12 | Disposition: A | Discharge status: Home / Self Care | Payer: Medicare Other | Source: Ambulatory Visit | Attending: Obstetrics & Gynecology | Admitting: Obstetrics & Gynecology

## 2016-04-12 ENCOUNTER — Ambulatory Visit (INDEPENDENT_AMBULATORY_CARE_PROVIDER_SITE_OTHER): Payer: Medicare Other | Admitting: Obstetrics & Gynecology

## 2016-04-12 ENCOUNTER — Encounter: Payer: Self-pay | Admitting: Obstetrics & Gynecology

## 2016-04-12 VITALS — BP 134/54 | HR 80 | Ht 62.0 in | Wt 156.0 lb

## 2016-04-12 DIAGNOSIS — N814 Uterovaginal prolapse, unspecified: Secondary | ICD-10-CM

## 2016-04-12 DIAGNOSIS — N898 Other specified noninflammatory disorders of vagina: Secondary | ICD-10-CM | POA: Diagnosis present

## 2016-04-12 MED ORDER — METRONIDAZOLE 500 MG PO TABS: 500.0000 mg | ORAL_TABLET | Freq: Two times a day (BID) | ORAL | 0 refills | Status: DC

## 2016-04-12 NOTE — Progress Notes (Signed)
HPI Pt reports that she has been using her topical EES 1-2 Times per week and reports that she has no discomfort from the pseeary. She has normal discharge.She denies pain. She keeps the pessary in. She has it changed here every 3 months.  She c/o of a small amount of bleeding that she reports is not heavy.   She does have a small amount of bleeding at the time of pessary removal and reinsertion.  Last PAP 05/13/2013- WNL; neg hr HPV    Review of Systems: [place]    Objective:  BP (!) 134/54 (BP Location: Left Arm)   Pulse 80   Ht 5\' 2"  (1.575 m)   Wt 156 lb (70.8 kg)   LMP  (LMP Unknown)   BMI 28.53 kg/m   Pt in NAD Pessary removed GU: EGBUS: no lesions Vagina: skin friable with a small amount of bleeding when touched; Grade III prolapse of  Cervix: no lesion.  Pessary removed, cleaned and replaced.    11/23/2014 Diagnosis Endometrium, biopsy - SCANT SUPERFICIAL BENIGN GLANDULAR ENDOMETRIAL FRAGMENTS. - INCIDENTAL BENIGN ENDOCERVICAL MUCOSAL FRAGMENTS. - NO ATYPIA OR MALIGNANCY IDENTIFIED    Assessment:    POP- pessary removed and cleaned. Vagina healthy     Plan:  F/u in 3 months of pessary cleaning and replacement EES cream prn F/u sooner prn         Silver Parkey L. Harraway-Smith, M.D., Evern CoreFACOG

## 2016-04-14 ENCOUNTER — Encounter: Payer: Self-pay | Admitting: Obstetrics & Gynecology

## 2016-04-15 LAB — CERVICOVAGINAL ANCILLARY ONLY: Wet Prep (BD Affirm): NEGATIVE

## 2016-05-29 ENCOUNTER — Other Ambulatory Visit: Payer: Self-pay | Admitting: *Deleted

## 2016-05-29 DIAGNOSIS — K219 Gastro-esophageal reflux disease without esophagitis: Secondary | ICD-10-CM

## 2016-05-29 MED ORDER — ESOMEPRAZOLE MAGNESIUM 40 MG PO CPDR: 40.0000 mg | DELAYED_RELEASE_CAPSULE | Freq: Every day | ORAL | 3 refills | Status: DC

## 2016-05-29 NOTE — Telephone Encounter (Signed)
RF request for Nexium 40 mg sent to [facility].  Pt due to see Dr Erin Fulling in May or June.

## 2016-07-10 ENCOUNTER — Ambulatory Visit (INDEPENDENT_AMBULATORY_CARE_PROVIDER_SITE_OTHER): Payer: Medicare Other | Admitting: Obstetrics & Gynecology

## 2016-07-10 ENCOUNTER — Encounter: Payer: Self-pay | Admitting: Obstetrics & Gynecology

## 2016-07-10 VITALS — BP 150/62 | HR 77 | Ht 62.0 in | Wt 158.0 lb

## 2016-07-10 DIAGNOSIS — N8111 Cystocele, midline: Secondary | ICD-10-CM | POA: Diagnosis not present

## 2016-07-10 DIAGNOSIS — N814 Uterovaginal prolapse, unspecified: Secondary | ICD-10-CM

## 2016-07-11 ENCOUNTER — Encounter: Payer: Self-pay | Admitting: Obstetrics & Gynecology

## 2016-07-11 NOTE — Progress Notes (Signed)
  HPI Pt presents today to have pessary removed and cleaned.  She denies problems and reports that her pessary is working very well.  She reports that she still uses the EES cream 2x/week. She reports that she dreads these visits due to the pain of removing the pessary and after it is removed and replaced she has mild spotting.  She reports that their is no spotting outside of the time period of the changing of the pessary.  She is afraid of surgery but, she wants definitive treatment.  Review of Systems: Pt report rare leakage of urine if she has to go to the BR badly. She denies stress sx      Objective:   Physical Exam Pt in NAD GYN: Pessary removed and cleaned; vaginal mucosa looks red and sl raw in appearance. No lesions noted  There is no pain. The pessary was removed, cleaned and replaced.     Assessment:     Uterine prolapse and cystocele Pessary check     Plan:     Patient desires surgical management with TVH with BSO and ant repair.  The risks of surgery were discussed in detail with the patient including but not limited to: bleeding which may require transfusion or reoperation; infection which may require prolonged hospitalization or re-hospitalization and antibiotic therapy; injury to bowel, bladder, ureters and major vessels or other surrounding organs; need for additional procedures including laparotomy; thromboembolic phenomenon, incisional problems and other postoperative or anesthesia complications.  Patient was told that the likelihood that her condition and symptoms will be treated effectively with this surgical management was very high; the postoperative expectations were also discussed in detail. The patient also understands the alternative treatment options which were discussed in full. All questions were answered.  She was told that she will be contacted by our surgical scheduler regarding the time and date of her surgery; routine preoperative instructions of having nothing  to eat or drink after midnight on the day prior to surgery and also coming to the hospital 1 1/2 hours prior to her time of surgery were also emphasized.  She was told she may be called for a preoperative appointment about a week prior to surgery and will be given further preoperative instructions at that visit. Pt will f/u for repeat exam and full discussion within 2-4 weeks of surgery.  Jazion Atteberry L. Harraway-Smith, M.D., Evern CoreFACOG

## 2016-07-30 ENCOUNTER — Other Ambulatory Visit: Payer: Medicare Other

## 2016-07-30 VITALS — BP 148/64 | Ht 62.0 in | Wt 158.0 lb

## 2016-07-30 DIAGNOSIS — R350 Frequency of micturition: Secondary | ICD-10-CM

## 2016-07-30 DIAGNOSIS — R300 Dysuria: Secondary | ICD-10-CM

## 2016-07-30 MED ORDER — NITROFURANTOIN MONOHYD MACRO 100 MG PO CAPS: 100.0000 mg | ORAL_CAPSULE | Freq: Two times a day (BID) | ORAL | 0 refills | Status: DC

## 2016-07-30 NOTE — Progress Notes (Signed)
SUBJECTIVE: Brenda Mccullough is a 71 y.o. female who complains of urinary frequency, urgency and for 4  days, without flank pain, fever, chills, or abnormal vaginal discharge. Patient reported some bleeding but vaginal bleeding from her pessary.   OBJECTIVE: Appears well, in no apparent distress.  Vital signs are normal. Urine dipstick positive for ketones, leukocytes, and blood.   ASSESSMENT:Urinary frequency and patient reports a urinary pressure feeling.  PLAN: Will send for urine culture.  Call or return to clinic prn if these symptoms worsen or fail to improve as anticipated. Armandina StammerJennifer Lisa-Marie Rueger RN BSN

## 2016-07-31 ENCOUNTER — Encounter (HOSPITAL_COMMUNITY): Payer: Self-pay

## 2016-08-04 LAB — CULTURE, URINE COMPREHENSIVE

## 2016-08-29 ENCOUNTER — Encounter: Payer: Self-pay | Admitting: Obstetrics & Gynecology

## 2016-08-29 ENCOUNTER — Ambulatory Visit (INDEPENDENT_AMBULATORY_CARE_PROVIDER_SITE_OTHER): Payer: Medicare Other | Admitting: Obstetrics & Gynecology

## 2016-08-29 VITALS — BP 143/61 | HR 76 | Ht 62.0 in | Wt 159.0 lb

## 2016-08-29 DIAGNOSIS — N8111 Cystocele, midline: Secondary | ICD-10-CM

## 2016-08-29 NOTE — Progress Notes (Signed)
History:  71 y.o. [id number] here today for pessary removal and cleaning and preop assessment and counseling.  Pt denies incontinence. She reports that she never has to push out her stool. She has a h/o diverticulitis and occ has diarrhea and constipation.      The following portions of the patient's history were reviewed and updated as appropriate: allergies, current medications, past family history, past medical history, past social history, past surgical history and problem list.  Review of Systems:  Pertinent items are noted in HPI.   Objective:  Physical Exam Blood pressure (!) 143/61, pulse 76, height 5\' 2"  (1.575 m), weight 159 lb (72.1 kg).  CONSTITUTIONAL: Well-developed, well-nourished female in no acute distress.  HENT:  Normocephalic, atraumatic EYES: Conjunctivae and EOM are normal. No scleral icterus.  NECK: Normal range of motion SKIN: Skin is warm and dry. No rash noted. Not diaphoretic.No pallor. NEUROLGIC: Alert and oriented to person, place, and time. Normal coordination.  Abd: Soft, nontender and nondistended Pelvic: Normal appearing external genitalia; normal appearing vaginal mucosa and cervix.  Thick yellow discharge- which is pts norm since she's had the pessary.  Small uterus with grade 2 prolapse. There is a midline cystocele.   no other palpable masses, no uterine or adnexal tenderness   Assessment & Plan:  Pessary removed and cleaned and replaced  Patient desires surgical management with total vaginal hysterectomy with BSO and ant repair.  The risks of surgery were discussed in detail with the patient including but not limited to: bleeding which may require transfusion or reoperation; infection which may require prolonged hospitalization or re-hospitalization and antibiotic therapy; injury to bowel, bladder, ureters and major vessels or other surrounding organs; need for additional procedures including laparotomy; thromboembolic phenomenon, incisional problems and  other postoperative or anesthesia complications.  Patient was told that the likelihood that her condition and symptoms will be treated effectively with this surgical management was very high; the postoperative expectations were also discussed in detail. The patient also understands the alternative treatment options which were discussed in full. All questions were answered.   She has been contacted for a preoperative appointment and will be given further preoperative instructions at that visit. Printed patient education handouts about the procedure were given to the patient to review at home.  Total face-to-face time with patient was 20 min.  Greater than 50% was spent in counseling and coordination of care with the patient.   Sayed Apostol L. Harraway-Smith, M.D., Evern CoreFACOG

## 2016-08-29 NOTE — Patient Instructions (Signed)
Anterior and Posterior Colporrhaphy Anterior or posterior colporrhaphy is surgery to fix a prolapse of organs in the genital tract. Prolapse means the falling down, bulging, dropping, or drooping of an organ. Organs that commonly prolapse include the rectum, bladder, vagina, and uterus. Prolapse can affect a single organ or several organs at the same time. This often worsens when women stop having their monthly periods (menopause) because estrogen loss weakens the muscles and tissues in the genital tract. In addition, prolapse happens when the organs are damaged or weakened. This commonly happens after childbirth and as a result of aging. Surgery is often done for severe prolapses. The type of colporrhaphy done depends on the type of genital prolapse. Types of genital prolapse include the following:  Cystocele. This is a prolapse of the upper (anterior) wall of the vagina. The anterior wall bulges into the vagina and brings the bladder with it.  Rectocele. This is a prolapse of the lower (posterior) wall of the vagina. The posterior vaginal wall bulges into the vagina and brings the rectum with it.  Enterocele. This is a prolapse of part of the pelvic organs called the pouch of Douglas. It also involves a portion of the small bowel. It appears as a bulge under the neck of the uterus at the top of the back wall of the vagina.  Procidentia. This is a complete prolapse of the uterus and the cervix. The prolapse can be seen and felt coming out of the vagina.  LET YOUR HEALTH CARE PROVIDER KNOW ABOUT:  Any allergies you have.  All medicines you are taking, including vitamins, herbs, eye drops, creams, and over-the-counter medicines.  Previous problems you or members of your family have had with the use of anesthetics.  Any blood disorders you have.  Previous surgeries you have had.  Medical conditions you have.  Smoking history or history of alcohol use.  Possibility of pregnancy, if this  applies. RISKS AND COMPLICATIONS Generally, anterior or posterior colporrhaphy is a safe procedure. However, as with any procedure, complications can occur. Possible complications include:  Infection.  Damage to other organs during surgery.  Bleeding after surgery.  Problems urinating.  Problems from the anesthetic.  BEFORE THE PROCEDURE  Ask your health care provider about changing or stopping your regular medicines.  Do not eat or drink anything for at least 8 hours before the surgery.  If you smoke, do not smoke for at least 2 weeks before the surgery.  Make plans to have someone drive you home after your hospital stay. Also, arrange for someone to help you with activities during recovery. PROCEDURE You may be given medicine to help you relax before the surgery (sedative). During the surgery you will be given medicine to make you sleep through the procedure (general anesthetic) or medicine to numb you from the waist down (spinal anesthetic). This medicine will be given through an intravenous (IV) access tube that is put into one of your veins. The procedure will vary depending on the type of repair:  Anterior repair. A cut (incision) is made in the midline section of the front part of the vaginal wall. A triangular-shaped piece of vaginal tissue is removed, and the stronger, healthier tissue is sewn together in order to support and suspend the bladder.  Posterior repair. An incision is made midline on the back wall of the vagina. A triangular portion of vaginal skin is removed to expose the muscle. Excess tissue is removed, and stronger, healthier muscle and ligament tissue is   sewn together to support the rectum.  Anterior and posterior repair. Both procedures are done during the same surgery.  What to expect after the procedure You will be taken to a recovery area. Your blood pressure, pulse, breathing, and temperature (vital signs) will be monitored. This is done until you are  stable. Then you will be transferred to a hospital room. After surgery, you will have a small rubber tube in place to drain your bladder (urinary catheter). This will be in place for 2 to 7 days or until your bladder is working properly on its own. The IV access tube will be removed in 1 to 3 days. You may have a gauze packing in your vagina to prevent bleeding. This will be removed 2 or 3 days after the surgery. You will likely need to stay in the hospital for 3 to 5 days. This information is not intended to replace advice given to you by your health care provider. Make sure you discuss any questions you have with your health care provider. Document Released: 05/04/2003 Document Revised: 07/20/2015 Document Reviewed: 07/03/2012 Elsevier Interactive Patient Education  2017 [employer]  

## 2016-08-30 NOTE — Patient Instructions (Signed)
Your procedure is scheduled on:  Thursday, September 12, 2016  [facility] at:  12 noon  Pick up the phone at the desk and dial [phone].  Call this number if you have problems the morning of surgery: [phone].  Remember: Do NOT eat food:  After Midnight Wednesday  Do NOT drink clear liquids after:  7:30 AM Thursday  Take these medicines the morning of surgery with a SIP OF WATER:  Amlodipine, Zyrtec, Nexium, Hydrochlorothiazide, Metoprolol  Stop ALL herbal medications and Omega 3 fish oil at this time  Do NOT smoke the day of surgery.  Do NOT wear jewelry (body piercing), metal hair clips/bobby pins, make-up, artifical eyelashes or nail polish. Do NOT wear lotions, powders, or perfumes.  You may wear deodorant. Do NOT shave for 48 hours prior to surgery. Do NOT bring valuables to the hospital. Contacts, dentures, or bridgework may not be worn into surgery.  Leave suitcase in car.  After surgery it may be brought to your room.  For patients admitted to the hospital, checkout time is 11:00 AM the day of discharge.  Bring a copy of your healthcare power of attorney and living will documents.

## 2016-09-02 ENCOUNTER — Other Ambulatory Visit: Payer: Self-pay

## 2016-09-02 ENCOUNTER — Encounter (HOSPITAL_COMMUNITY): Payer: Self-pay

## 2016-09-02 ENCOUNTER — Encounter (HOSPITAL_COMMUNITY)
Admission: RE | Admit: 2016-09-02 | Discharge: 2016-09-02 | Disposition: A | Discharge status: Home / Self Care | Payer: Medicare Other | Source: Ambulatory Visit | Attending: Obstetrics & Gynecology | Admitting: Obstetrics & Gynecology

## 2016-09-02 DIAGNOSIS — Z01818 Encounter for other preprocedural examination: Secondary | ICD-10-CM | POA: Diagnosis not present

## 2016-09-02 DIAGNOSIS — I517 Cardiomegaly: Secondary | ICD-10-CM | POA: Diagnosis not present

## 2016-09-02 DIAGNOSIS — Z01810 Encounter for preprocedural cardiovascular examination: Secondary | ICD-10-CM | POA: Insufficient documentation

## 2016-09-02 DIAGNOSIS — N8111 Cystocele, midline: Secondary | ICD-10-CM | POA: Insufficient documentation

## 2016-09-02 DIAGNOSIS — I444 Left anterior fascicular block: Secondary | ICD-10-CM | POA: Insufficient documentation

## 2016-09-02 DIAGNOSIS — I440 Atrioventricular block, first degree: Secondary | ICD-10-CM | POA: Insufficient documentation

## 2016-09-02 HISTORY — DX: Cardiac murmur, unspecified: R01.1

## 2016-09-02 LAB — COMPREHENSIVE METABOLIC PANEL
ALT: 16 U/L (ref 14–54)
ANION GAP: 6 (ref 5–15)
AST: 17 U/L (ref 15–41)
Albumin: 4 g/dL (ref 3.5–5.0)
Alkaline Phosphatase: 76 U/L (ref 38–126)
BUN: 13 mg/dL (ref 6–20)
CHLORIDE: 98 mmol/L — AB (ref 101–111)
CO2: 30 mmol/L (ref 22–32)
Calcium: 9.4 mg/dL (ref 8.9–10.3)
Creatinine, Ser: 0.69 mg/dL (ref 0.44–1.00)
Glucose, Bld: 94 mg/dL (ref 65–99)
POTASSIUM: 3.6 mmol/L (ref 3.5–5.1)
Sodium: 134 mmol/L — ABNORMAL LOW (ref 135–145)
TOTAL PROTEIN: 7.5 g/dL (ref 6.5–8.1)
Total Bilirubin: 0.6 mg/dL (ref 0.3–1.2)

## 2016-09-02 LAB — CBC
HEMATOCRIT: 38.5 % (ref 36.0–46.0)
Hemoglobin: 13 g/dL (ref 12.0–15.0)
MCH: 28.7 pg (ref 26.0–34.0)
MCHC: 33.8 g/dL (ref 30.0–36.0)
MCV: 85 fL (ref 78.0–100.0)
PLATELETS: 196 10*3/uL (ref 150–400)
RBC: 4.53 MIL/uL (ref 3.87–5.11)
RDW: 13.8 % (ref 11.5–15.5)
WBC: 4.8 10*3/uL (ref 4.0–10.5)

## 2016-09-02 LAB — TYPE AND SCREEN
ABO/RH(D): O POS
ANTIBODY SCREEN: NEGATIVE

## 2016-09-02 LAB — ABO/RH: ABO/RH(D): O POS

## 2016-09-02 NOTE — Pre-Procedure Instructions (Signed)
Dr. Renold DonGermeroth reviewed EKG and medical history no new orders at this time.

## 2016-09-12 ENCOUNTER — Inpatient Hospital Stay (HOSPITAL_COMMUNITY): Payer: Medicare Other | Admitting: Anesthesiology

## 2016-09-12 ENCOUNTER — Inpatient Hospital Stay (HOSPITAL_COMMUNITY)
Admission: RE | Admit: 2016-09-12 | Discharge: 2016-09-13 | DRG: 743 | Disposition: A | Discharge status: Home / Self Care | Payer: Medicare Other | Source: Ambulatory Visit | Attending: Obstetrics & Gynecology | Admitting: Obstetrics & Gynecology

## 2016-09-12 ENCOUNTER — Encounter (HOSPITAL_COMMUNITY): Payer: Self-pay

## 2016-09-12 ENCOUNTER — Encounter (HOSPITAL_COMMUNITY)
Admission: RE | Disposition: A | Discharge status: Home / Self Care | Payer: Self-pay | Source: Ambulatory Visit | Attending: Obstetrics & Gynecology

## 2016-09-12 DIAGNOSIS — Z9889 Other specified postprocedural states: Secondary | ICD-10-CM

## 2016-09-12 DIAGNOSIS — N8111 Cystocele, midline: Secondary | ICD-10-CM | POA: Diagnosis not present

## 2016-09-12 DIAGNOSIS — Z87891 Personal history of nicotine dependence: Secondary | ICD-10-CM | POA: Diagnosis not present

## 2016-09-12 DIAGNOSIS — I10 Essential (primary) hypertension: Secondary | ICD-10-CM | POA: Diagnosis present

## 2016-09-12 DIAGNOSIS — N814 Uterovaginal prolapse, unspecified: Secondary | ICD-10-CM | POA: Diagnosis present

## 2016-09-12 DIAGNOSIS — N819 Female genital prolapse, unspecified: Secondary | ICD-10-CM | POA: Diagnosis present

## 2016-09-12 DIAGNOSIS — K219 Gastro-esophageal reflux disease without esophagitis: Secondary | ICD-10-CM | POA: Diagnosis present

## 2016-09-12 DIAGNOSIS — Z7982 Long term (current) use of aspirin: Secondary | ICD-10-CM

## 2016-09-12 HISTORY — PX: VAGINAL HYSTERECTOMY: SHX2639

## 2016-09-12 HISTORY — PX: SALPINGOOPHORECTOMY: SHX82

## 2016-09-12 HISTORY — PX: RECTOCELE REPAIR: SHX761

## 2016-09-12 HISTORY — PX: CYSTOCELE REPAIR: SHX163

## 2016-09-12 SURGERY — HYSTERECTOMY, VAGINAL
Anesthesia: General

## 2016-09-12 MED ORDER — METOPROLOL TARTRATE 25 MG PO TABS
12.5000 mg | ORAL_TABLET | Freq: Two times a day (BID) | ORAL | Status: DC
  Administered 2016-09-12 – 2016-09-13 (×2): 12.5 mg via ORAL
  Filled 2016-09-12 (×2): qty 0.5
  Filled 2016-09-12 (×2): qty 1

## 2016-09-12 MED ORDER — SUGAMMADEX SODIUM 200 MG/2ML IV SOLN
INTRAVENOUS | Status: DC | PRN
  Administered 2016-09-12: 145 mg via INTRAVENOUS

## 2016-09-12 MED ORDER — FENTANYL CITRATE (PF) 100 MCG/2ML IJ SOLN
INTRAMUSCULAR | Status: DC | PRN
  Administered 2016-09-12 (×5): 50 ug via INTRAVENOUS

## 2016-09-12 MED ORDER — FENTANYL CITRATE (PF) 100 MCG/2ML IJ SOLN
INTRAMUSCULAR | Status: AC
  Filled 2016-09-12: qty 2

## 2016-09-12 MED ORDER — ONDANSETRON HCL 4 MG/2ML IJ SOLN
INTRAMUSCULAR | Status: DC | PRN
  Administered 2016-09-12: 4 mg via INTRAVENOUS

## 2016-09-12 MED ORDER — DEXAMETHASONE SODIUM PHOSPHATE 4 MG/ML IJ SOLN
INTRAMUSCULAR | Status: DC | PRN
  Administered 2016-09-12: 10 mg via INTRAVENOUS

## 2016-09-12 MED ORDER — HYDROMORPHONE 1 MG/ML IV SOLN
INTRAVENOUS | Status: DC
  Administered 2016-09-12: 19:00:00 via INTRAVENOUS
  Administered 2016-09-13: 0 mg via INTRAVENOUS
  Administered 2016-09-13: 0.6 mg via INTRAVENOUS
  Filled 2016-09-12: qty 25

## 2016-09-12 MED ORDER — IBUPROFEN 600 MG PO TABS: 600.0000 mg | ORAL_TABLET | Freq: Four times a day (QID) | ORAL | Status: DC | PRN

## 2016-09-12 MED ORDER — FENTANYL CITRATE (PF) 100 MCG/2ML IJ SOLN: 25.0000 ug | INTRAMUSCULAR | Status: DC | PRN

## 2016-09-12 MED ORDER — LIDOCAINE HCL (CARDIAC) 20 MG/ML IV SOLN
INTRAVENOUS | Status: DC | PRN
  Administered 2016-09-12: 60 mg via INTRAVENOUS

## 2016-09-12 MED ORDER — DIPHENHYDRAMINE HCL 12.5 MG/5ML PO ELIX: 12.5000 mg | ORAL_SOLUTION | Freq: Four times a day (QID) | ORAL | Status: DC | PRN

## 2016-09-12 MED ORDER — DIPHENHYDRAMINE HCL 50 MG/ML IJ SOLN
12.5000 mg | Freq: Four times a day (QID) | INTRAMUSCULAR | Status: DC | PRN
Start: 2016-09-12 — End: 2016-09-13

## 2016-09-12 MED ORDER — SODIUM CHLORIDE 0.9 % IJ SOLN
INTRAMUSCULAR | Status: AC
  Filled 2016-09-12: qty 100

## 2016-09-12 MED ORDER — PROMETHAZINE HCL 25 MG/ML IJ SOLN
INTRAMUSCULAR | Status: AC
  Administered 2016-09-12: 12.5 mg via INTRAVENOUS
  Filled 2016-09-12: qty 1

## 2016-09-12 MED ORDER — SUGAMMADEX SODIUM 200 MG/2ML IV SOLN
INTRAVENOUS | Status: AC
  Filled 2016-09-12: qty 2

## 2016-09-12 MED ORDER — BISACODYL 10 MG RE SUPP: 10.0000 mg | Freq: Every day | RECTAL | Status: DC | PRN

## 2016-09-12 MED ORDER — KCL IN DEXTROSE-NACL 40-5-0.45 MEQ/L-%-% IV SOLN: INTRAVENOUS | Status: DC

## 2016-09-12 MED ORDER — METOCLOPRAMIDE HCL 5 MG/ML IJ SOLN
INTRAMUSCULAR | Status: AC
  Filled 2016-09-12: qty 2

## 2016-09-12 MED ORDER — DEXAMETHASONE SODIUM PHOSPHATE 10 MG/ML IJ SOLN
INTRAMUSCULAR | Status: AC
  Filled 2016-09-12: qty 1

## 2016-09-12 MED ORDER — MEPERIDINE HCL 25 MG/ML IJ SOLN: 6.2500 mg | INTRAMUSCULAR | Status: DC | PRN

## 2016-09-12 MED ORDER — ONDANSETRON HCL 4 MG/2ML IJ SOLN
INTRAMUSCULAR | Status: AC
  Filled 2016-09-12: qty 2

## 2016-09-12 MED ORDER — VASOPRESSIN 20 UNIT/ML IV SOLN
INTRAVENOUS | Status: AC
  Filled 2016-09-12: qty 1

## 2016-09-12 MED ORDER — AMLODIPINE BESYLATE 10 MG PO TABS
10.0000 mg | ORAL_TABLET | Freq: Every day | ORAL | Status: DC
  Administered 2016-09-13: 10 mg via ORAL
  Filled 2016-09-12: qty 1

## 2016-09-12 MED ORDER — ONDANSETRON HCL 4 MG/2ML IJ SOLN: 4.0000 mg | Freq: Four times a day (QID) | INTRAMUSCULAR | Status: DC | PRN

## 2016-09-12 MED ORDER — GLYCOPYRROLATE 0.2 MG/ML IJ SOLN
INTRAMUSCULAR | Status: DC | PRN
  Administered 2016-09-12: 0.1 mg via INTRAVENOUS

## 2016-09-12 MED ORDER — NALOXONE HCL 0.4 MG/ML IJ SOLN
0.4000 mg | INTRAMUSCULAR | Status: DC | PRN
Start: 2016-09-12 — End: 2016-09-13

## 2016-09-12 MED ORDER — BUPIVACAINE HCL (PF) 0.5 % IJ SOLN
INTRAMUSCULAR | Status: DC | PRN
  Administered 2016-09-12: 30 mL

## 2016-09-12 MED ORDER — HYDROCHLOROTHIAZIDE 25 MG PO TABS
25.0000 mg | ORAL_TABLET | Freq: Every day | ORAL | Status: DC
  Administered 2016-09-13: 25 mg via ORAL
  Filled 2016-09-12: qty 1

## 2016-09-12 MED ORDER — MIDAZOLAM HCL 2 MG/2ML IJ SOLN
INTRAMUSCULAR | Status: AC
  Filled 2016-09-12: qty 2

## 2016-09-12 MED ORDER — FENTANYL CITRATE (PF) 100 MCG/2ML IJ SOLN
25.0000 ug | INTRAMUSCULAR | Status: DC | PRN
  Administered 2016-09-12 (×3): 25 ug via INTRAVENOUS

## 2016-09-12 MED ORDER — BUPIVACAINE HCL (PF) 0.5 % IJ SOLN
INTRAMUSCULAR | Status: AC
  Filled 2016-09-12: qty 30

## 2016-09-12 MED ORDER — OXYCODONE-ACETAMINOPHEN 5-325 MG PO TABS: 1.0000 | ORAL_TABLET | ORAL | Status: DC | PRN

## 2016-09-12 MED ORDER — LACTATED RINGERS IV SOLN
INTRAVENOUS | Status: DC
  Administered 2016-09-12: 125 mL/h via INTRAVENOUS
  Administered 2016-09-12 (×2): via INTRAVENOUS

## 2016-09-12 MED ORDER — MIDAZOLAM HCL 2 MG/2ML IJ SOLN
INTRAMUSCULAR | Status: DC | PRN
  Administered 2016-09-12: 1 mg via INTRAVENOUS

## 2016-09-12 MED ORDER — PROPOFOL 10 MG/ML IV BOLUS
INTRAVENOUS | Status: AC
  Filled 2016-09-12: qty 20

## 2016-09-12 MED ORDER — KETOROLAC TROMETHAMINE 30 MG/ML IJ SOLN: 30.0000 mg | Freq: Four times a day (QID) | INTRAMUSCULAR | Status: DC

## 2016-09-12 MED ORDER — ROCURONIUM BROMIDE 100 MG/10ML IV SOLN
INTRAVENOUS | Status: AC
  Filled 2016-09-12: qty 1

## 2016-09-12 MED ORDER — ONDANSETRON HCL 4 MG/2ML IJ SOLN: 4.0000 mg | Freq: Once | INTRAMUSCULAR | Status: DC | PRN

## 2016-09-12 MED ORDER — ROCURONIUM BROMIDE 100 MG/10ML IV SOLN
INTRAVENOUS | Status: DC | PRN
  Administered 2016-09-12: 50 mg via INTRAVENOUS
  Administered 2016-09-12: 10 mg via INTRAVENOUS

## 2016-09-12 MED ORDER — ESTRADIOL 0.1 MG/GM VA CREA
TOPICAL_CREAM | VAGINAL | Status: DC | PRN
  Administered 2016-09-12: 1 via VAGINAL

## 2016-09-12 MED ORDER — SODIUM CHLORIDE 0.9% FLUSH: 9.0000 mL | INTRAVENOUS | Status: DC | PRN

## 2016-09-12 MED ORDER — HYDROMORPHONE HCL 1 MG/ML IJ SOLN: 0.2000 mg | INTRAMUSCULAR | Status: DC | PRN

## 2016-09-12 MED ORDER — KETOROLAC TROMETHAMINE 30 MG/ML IJ SOLN
15.0000 mg | Freq: Four times a day (QID) | INTRAMUSCULAR | Status: DC
  Administered 2016-09-12: 15 mg via INTRAMUSCULAR
  Filled 2016-09-12: qty 1

## 2016-09-12 MED ORDER — LIDOCAINE HCL (CARDIAC) 20 MG/ML IV SOLN
INTRAVENOUS | Status: AC
  Filled 2016-09-12: qty 5

## 2016-09-12 MED ORDER — DOCUSATE SODIUM 100 MG PO CAPS
100.0000 mg | ORAL_CAPSULE | Freq: Two times a day (BID) | ORAL | Status: DC
  Administered 2016-09-12 – 2016-09-13 (×2): 100 mg via ORAL
  Filled 2016-09-12 (×2): qty 1

## 2016-09-12 MED ORDER — FENTANYL CITRATE (PF) 250 MCG/5ML IJ SOLN
INTRAMUSCULAR | Status: AC
  Filled 2016-09-12: qty 5

## 2016-09-12 MED ORDER — ONDANSETRON HCL 4 MG/2ML IJ SOLN
4.0000 mg | Freq: Once | INTRAMUSCULAR | Status: AC | PRN
  Administered 2016-09-12: 4 mg via INTRAVENOUS

## 2016-09-12 MED ORDER — POTASSIUM CHLORIDE 2 MEQ/ML IV SOLN
INTRAVENOUS | Status: DC
  Administered 2016-09-12 – 2016-09-13 (×2): via INTRAVENOUS
  Filled 2016-09-12 (×3): qty 1000

## 2016-09-12 MED ORDER — MENTHOL 3 MG MT LOZG: 1.0000 | LOZENGE | OROMUCOSAL | Status: DC | PRN

## 2016-09-12 MED ORDER — KETOROLAC TROMETHAMINE 15 MG/ML IJ SOLN
15.0000 mg | Freq: Four times a day (QID) | INTRAMUSCULAR | Status: DC
  Administered 2016-09-13 (×2): 15 mg via INTRAVENOUS
  Filled 2016-09-12 (×8): qty 1

## 2016-09-12 MED ORDER — PROPOFOL 10 MG/ML IV BOLUS
INTRAVENOUS | Status: DC | PRN
  Administered 2016-09-12: 150 mg via INTRAVENOUS

## 2016-09-12 MED ORDER — ONDANSETRON HCL 4 MG/2ML IJ SOLN
4.0000 mg | Freq: Four times a day (QID) | INTRAMUSCULAR | Status: DC | PRN
  Administered 2016-09-13: 4 mg via INTRAVENOUS
  Filled 2016-09-12: qty 2

## 2016-09-12 MED ORDER — CEFAZOLIN SODIUM-DEXTROSE 2-4 GM/100ML-% IV SOLN
2.0000 g | INTRAVENOUS | Status: AC
  Administered 2016-09-12: 2 g via INTRAVENOUS

## 2016-09-12 MED ORDER — ONDANSETRON HCL 4 MG/2ML IJ SOLN
INTRAMUSCULAR | Status: AC
Start: 2016-09-12 — End: ?
  Filled 2016-09-12: qty 2

## 2016-09-12 MED ORDER — POLYETHYLENE GLYCOL 3350 17 G PO PACK: 17.0000 g | PACK | Freq: Every day | ORAL | Status: DC | PRN

## 2016-09-12 MED ORDER — METOCLOPRAMIDE HCL 5 MG/ML IJ SOLN
INTRAMUSCULAR | Status: DC | PRN
  Administered 2016-09-12: 10 mg via INTRAVENOUS

## 2016-09-12 MED ORDER — PANTOPRAZOLE SODIUM 40 MG PO TBEC
40.0000 mg | DELAYED_RELEASE_TABLET | Freq: Every morning | ORAL | Status: DC
  Administered 2016-09-13: 40 mg via ORAL
  Filled 2016-09-12: qty 1

## 2016-09-12 MED ORDER — ASPIRIN EC 81 MG PO TBEC
81.0000 mg | DELAYED_RELEASE_TABLET | Freq: Every day | ORAL | Status: DC
  Administered 2016-09-13: 81 mg via ORAL
  Filled 2016-09-12 (×2): qty 1

## 2016-09-12 MED ORDER — SIMETHICONE 80 MG PO CHEW: 80.0000 mg | CHEWABLE_TABLET | Freq: Four times a day (QID) | ORAL | Status: DC | PRN

## 2016-09-12 MED ORDER — ONDANSETRON HCL 4 MG PO TABS: 4.0000 mg | ORAL_TABLET | Freq: Four times a day (QID) | ORAL | Status: DC | PRN

## 2016-09-12 MED ORDER — ESTRADIOL 0.1 MG/GM VA CREA
TOPICAL_CREAM | VAGINAL | Status: AC
  Filled 2016-09-12: qty 42.5

## 2016-09-12 MED ORDER — PROMETHAZINE HCL 25 MG/ML IJ SOLN
12.5000 mg | Freq: Once | INTRAMUSCULAR | Status: AC
  Administered 2016-09-12: 12.5 mg via INTRAVENOUS

## 2016-09-12 MED ORDER — MAGNESIUM CITRATE PO SOLN: 1.0000 | Freq: Once | ORAL | Status: DC | PRN

## 2016-09-12 SURGICAL SUPPLY — 32 items
CANISTER SUCT 3000ML PPV (MISCELLANEOUS) ×5 IMPLANT
CLOTH BEACON ORANGE TIMEOUT ST (SAFETY) ×5 IMPLANT
CONT PATH 16OZ SNAP LID 3702 (MISCELLANEOUS) ×5 IMPLANT
COVER MAYO STAND STRL (DRAPES) ×5 IMPLANT
DECANTER SPIKE VIAL GLASS SM (MISCELLANEOUS) IMPLANT
GAUZE PACKING 2X5 YD STRL (GAUZE/BANDAGES/DRESSINGS) ×5 IMPLANT
GLOVE BIO SURGEON STRL SZ7 (GLOVE) ×5 IMPLANT
GLOVE BIOGEL PI IND STRL 6.5 (GLOVE) ×3 IMPLANT
GLOVE BIOGEL PI IND STRL 7.0 (GLOVE) ×9 IMPLANT
GLOVE BIOGEL PI INDICATOR 6.5 (GLOVE) ×2
GLOVE BIOGEL PI INDICATOR 7.0 (GLOVE) ×6
GOWN STRL REUS W/TWL LRG LVL3 (GOWN DISPOSABLE) ×20 IMPLANT
GOWN STRL REUS W/TWL XL LVL3 (GOWN DISPOSABLE) ×5 IMPLANT
NEEDLE MAYO .5 CIRCLE (NEEDLE) ×5 IMPLANT
NEEDLE SPNL 20GX3.5 QUINCKE YW (NEEDLE) ×10 IMPLANT
NS IRRIG 1000ML POUR BTL (IV SOLUTION) ×5 IMPLANT
PACK TRENDGUARD 600 HYBRD PROC (MISCELLANEOUS) IMPLANT
PACK VAGINAL WOMENS (CUSTOM PROCEDURE TRAY) ×5 IMPLANT
PAD OB MATERNITY 4.3X12.25 (PERSONAL CARE ITEMS) ×5 IMPLANT
SHEARS FOC LG CVD HARMONIC 17C (MISCELLANEOUS) IMPLANT
SUT VIC AB 0 CT1 18XCR BRD8 (SUTURE) ×9 IMPLANT
SUT VIC AB 0 CT1 36 (SUTURE) ×10 IMPLANT
SUT VIC AB 0 CT1 8-18 (SUTURE) ×15
SUT VIC AB 2-0 CT1 27 (SUTURE) ×5
SUT VIC AB 2-0 CT1 TAPERPNT 27 (SUTURE) ×3 IMPLANT
SUT VIC AB 2-0 SH 27 (SUTURE) ×15
SUT VIC AB 2-0 SH 27XBRD (SUTURE) ×9 IMPLANT
SUT VICRYL 0 TIES 12 18 (SUTURE) ×5 IMPLANT
SYR 30ML LL (SYRINGE) IMPLANT
TOWEL OR 17X24 6PK STRL BLUE (TOWEL DISPOSABLE) ×10 IMPLANT
TRAY FOLEY CATH SILVER 14FR (SET/KITS/TRAYS/PACK) ×5 IMPLANT
TRENDGUARD 600 HYBRID PROC PK (MISCELLANEOUS)

## 2016-09-12 NOTE — Brief Op Note (Signed)
09/12/2016  4:06 PM  PATIENT:  Brenda Mccullough  71 y.o. female  PRE-OPERATIVE DIAGNOSIS:  Uterine Prolapse Cystocele  POST-OPERATIVE DIAGNOSIS:  Uterine Prolapse Cystocele  PROCEDURE:  Procedure(s): HYSTERECTOMY VAGINAL (N/A) ANTERIOR REPAIR (CYSTOCELE) (N/A) BILATERAL SALPINGO OOPHORECTOMY (Bilateral) POSTERIOR REPAIR (RECTOCELE) AND PERINEORRHAPHY  SURGEON:  Surgeon(s) and Role:    * Willodean RosenthalHarraway-Smith, Marinus Eicher, MD - Primary    * Dove, Leanora IvanoffMyra C, MD - Assisting  ANESTHESIA:   general  EBL:  Total I/O In: 1900 [I.V.:1900] Out: 1050 [Urine:800; Blood:250]  BLOOD ADMINISTERED:none  DRAINS: none   LOCAL MEDICATIONS USED:  MARCAINE    and OTHER Dilute vasopressin  SPECIMEN:  Source of Specimen:  uterus, cervix and bilateral ovaries adn fallopian tubes and vaginal mucosa  DISPOSITION OF SPECIMEN:  PATHOLOGY  COUNTS:  YES  TOURNIQUET:  * No tourniquets in log *  DICTATION: .Note written in EPIC  PLAN OF CARE: Admit for overnight observation  PATIENT DISPOSITION:  PACU - hemodynamically stable.   Delay start of Pharmacological VTE agent (>24hrs) due to surgical blood loss or risk of bleeding: yes  Complications: none immediate  Jen Eppinger L. Harraway-Smith, M.D., Evern CoreFACOG

## 2016-09-12 NOTE — Op Note (Signed)
09/12/2016  4:06 PM  PATIENT:  Brenda Mccullough  71 y.o. female  PRE-OPERATIVE DIAGNOSIS:  Uterine Prolapse Cystocele  POST-OPERATIVE DIAGNOSIS:  Uterine Prolapse Cystocele  PROCEDURE:  Procedure(s): HYSTERECTOMY VAGINAL (N/A) ANTERIOR REPAIR (CYSTOCELE) (N/A) BILATERAL SALPINGO OOPHORECTOMY (Bilateral) POSTERIOR REPAIR (RECTOCELE) AND PERINEORRHAPHY  SURGEON:  Surgeon(s) and Role:    * Willodean Rosenthal, MD - Primary    * Mccullough, Leanora Ivanoff, MD - Assisting  ANESTHESIA:   general  EBL:  Total I/O In: 1900 [I.V.:1900] Out: 1050 [Urine:800; Blood:250]  BLOOD ADMINISTERED:none  DRAINS: none   LOCAL MEDICATIONS USED:  MARCAINE    and OTHER Dilute vasopressin  SPECIMEN:  Source of Specimen:  uterus, cervix and bilateral ovaries adn fallopian tubes and vaginal mucosa  DISPOSITION OF SPECIMEN:  PATHOLOGY  COUNTS:  YES  TOURNIQUET:  * No tourniquets in log *  DICTATION: .Note written in EPIC  PLAN OF CARE: Admit for overnight observation  PATIENT DISPOSITION:  PACU - hemodynamically stable.   Delay start of Pharmacological VTE agent (>24hrs) due to surgical blood loss or risk of bleeding: yes  Complications: none immediate  INDICATIONS: The patient is a 71 y.o. [id number] with history of symptomatic uterine prolapse with cystocele and rectocele. The patient made a decision to undergo definite surgical treatment. On the preoperative visit, the risks, benefits, indications, and alternatives of the procedure were reviewed with the patient.  On the day of surgery, the risks of surgery were again discussed with the patient including but not limited to: bleeding which may require transfusion or reoperation; infection which may require antibiotics; injury to bowel, bladder, ureters or other surrounding organs; need for additional procedures; thromboembolic phenomenon, incisional problems and other postoperative/anesthesia complications. Written informed consent was obtained.     OPERATIVE FINDINGS: A 6 week size uterus with normal ovaries bilaterally. The right fallopian tubes was normal in appearance but, the left fallopian tube contained a 2 cm cyst.    DESCRIPTION OF PROCEDURE:  The patient received intravenous antibiotics and had sequential compression devices applied to her lower extremities while in the preoperative area.  She was then taken to the operating room where general anesthesia was administered and was found to be adequate.  She was placed in the dorsal lithotomy position, and was prepped and draped in a sterile manner after her pessary was removed. .  A Foley catheter was placed.  After an adequate timeout was performed, attention was turned to her pelvis.  A weighted speculum was then placed in the vagina, and the anterior and posterior lips of the cervix were grasped bilaterally with tenaculums.  The cervix was then injected circumferentially with a dilute Vasopression solution.  The cervix was then circumferentially incised, and the bladder was dissected off the pubocervical fascia without complication.  The posterior cul-de-sac was entered sharply without difficulty. A suture was placed on the posterior vagina.  A long weighted speculum was inserted into the posterior cul-de-sac.  The Heaney clamp was then used to clamp the uterosacral ligaments on either side.  They were then cut and sutured ligated with 0 Vicryl, and the ligated uterosacral ligaments were transfixed to the posterior lateral vaginal epithelium to further support the vagina and provide hemostasis. Of note, all sutures used in this case were 0 Vicryl unless otherwise noted.   The cardinal ligaments were then clamped, cut and ligated. The anterior cul-de-sac was then entered sharpely. The uterine vessels and broad ligaments were then serially clamped with the Heaney clamps, cut, and suture ligated  on both sides.  Excellent hemostasis was noted at this point.  The uterus was then delivered via the  posterior cul-de-sac, and the cornua were clamped with the Heaney clamps, transected, and the uterus was delivered and sent to pathology. These pedicles were then suture ligated to ensure hemostasis.  After completion of the hysterectomy, The infundibulopelvic ligament on the right was grasped with a Kelly clamp, transected and suture ligated. The same was performed on the left . All pedicles from the uterosacral ligament to the cornua were examined hemostasis was confirmed.  The vaginal peritoneum was closed in a running pursestring suture.At this point the case was turned over to Dr. Nicholaus BloomMyra Mccullough who I assisted in completing the Ant and post repair  and perineorrhaphy.   Brenda Mccullough, M.D., Brenda CoreFACOG

## 2016-09-12 NOTE — Anesthesia Postprocedure Evaluation (Signed)
Anesthesia Post Note  Patient: Garald BraverDorothy W Kirt  Procedure(s) Performed: Procedure(s) (LRB): HYSTERECTOMY VAGINAL (N/A) ANTERIOR REPAIR (CYSTOCELE) (N/A) BILATERAL SALPINGO OOPHORECTOMY (Bilateral) POSTERIOR REPAIR (RECTOCELE) AND PERINEORRHAPHY     Patient location during evaluation: PACU Anesthesia Type: General Level of consciousness: awake Pain management: pain level controlled Vital Signs Assessment: post-procedure vital signs reviewed and stable Respiratory status: spontaneous breathing Cardiovascular status: stable Anesthetic complications: no    Last Vitals:  Vitals:   09/12/16 1700 09/12/16 1715  BP: (!) 147/57 (!) 144/58  Pulse: 73 77  Resp: (!) 21 (!) 28  Temp:      Last Pain:  Vitals:   09/12/16 1715  TempSrc:   PainSc: 7    Pain Goal: Patients Stated Pain Goal: 3 (09/12/16 1715)               Artemisia Auvil JR,JOHN Susann GivensFRANKLIN

## 2016-09-12 NOTE — Anesthesia Procedure Notes (Signed)
Procedure Name: Intubation Date/Time: 09/12/2016 1:35 PM Performed by: Flossie Dibble Pre-anesthesia Checklist: Patient identified, Emergency Drugs available, Suction available and Patient being monitored Patient Re-evaluated:Patient Re-evaluated prior to induction Oxygen Delivery Method: Circle system utilized Preoxygenation: Pre-oxygenation with 100% oxygen Induction Type: IV induction Ventilation: Oral airway inserted - appropriate to patient size and Mask ventilation with difficulty Laryngoscope Size: Mac and 3 (Grade 1 view with cricoid pressure.) Grade View: Grade I Tube type: Oral Tube size: 7.0 mm Number of attempts: 1 Airway Equipment and Method: Stylet Placement Confirmation: ETT inserted through vocal cords under direct vision,  breath sounds checked- equal and bilateral and positive ETCO2 Secured at: 20.5 cm Tube secured with: Tape Dental Injury: Teeth and Oropharynx as per pre-operative assessment

## 2016-09-12 NOTE — Anesthesia Preprocedure Evaluation (Addendum)
Anesthesia Evaluation  Patient identified by MRN, date of birth, ID band Patient awake    Reviewed: Allergy & Precautions, NPO status , Patient's Chart, lab work & pertinent test results  Airway Mallampati: II  TM Distance: >3 FB Neck ROM: Full    Dental no notable dental hx.    Pulmonary neg pulmonary ROS, former smoker,    Pulmonary exam normal breath sounds clear to auscultation       Cardiovascular hypertension, negative cardio ROS Normal cardiovascular exam+ Valvular Problems/Murmurs  Rhythm:Regular Rate:Normal     Neuro/Psych negative neurological ROS  negative psych ROS   GI/Hepatic negative GI ROS, Neg liver ROS, GERD  Medicated,  Endo/Other  negative endocrine ROS  Renal/GU negative Renal ROS  negative genitourinary   Musculoskeletal negative musculoskeletal ROS (+)   Abdominal   Peds negative pediatric ROS (+)  Hematology negative hematology ROS (+)   Anesthesia Other Findings Ekg  7/18  Sinus rhythm with 1st degree A-V block Left atrial enlargement Left axis deviation Left anterior fasicular block  Reproductive/Obstetrics negative OB ROS                            Anesthesia Physical Anesthesia Plan  ASA: II  Anesthesia Plan: General   Post-op Pain Management:    Induction: Intravenous  PONV Risk Score and Plan: 2 and Ondansetron, Dexamethasone, Treatment may vary due to age or medical condition and Propofol  Airway Management Planned: Oral ETT  Additional Equipment:   Intra-op Plan:   Post-operative Plan: Extubation in OR  Informed Consent:   Plan Discussed with:   Anesthesia Plan Comments: (  )        Anesthesia Quick Evaluation

## 2016-09-12 NOTE — H&P (Signed)
Preoperative History and Physical  Brenda Mccullough is a 71 y.o. [phone] here for surgical management of uterine prolapse with cystocele.   Proposed surgery: total vaginal hysterectomy with anterior repair  Past Medical History:  Diagnosis Date  . Arthritis   . Diverticulosis   . GERD (gastroesophageal reflux disease)   . Heart murmur   . Hiatal hernia   . Hypertension   . Mitral valve prolapse    Past Surgical History:  Procedure Laterality Date  . BACK SURGERY    . CARPAL TUNNEL RELEASE    . COLONOSCOPY    . KNEE SURGERY     OB History    Gravida Para Term Preterm AB Living   3 3 3     3    SAB TAB Ectopic Multiple Live Births           3     Patient denies any cervical dysplasia or STIs. Prescriptions Prior to Admission  Medication Sig Dispense Refill Last Dose  . amitriptyline (ELAVIL) 25 MG tablet Take 1 tablet (25 mg total) by mouth at bedtime. (Patient taking differently: Take 12.5-25 mg by mouth at bedtime as needed for sleep. ) 30 tablet 3 09/11/2016 at 2000  . amLODipine (NORVASC) 10 MG tablet Take 10 mg by mouth daily.   09/12/2016 at 0700  . aspirin EC 81 MG tablet Take 81 mg by mouth daily.   09/11/2016 at Unknown time  . Calcium Carbonate-Vitamin D (CALCIUM 600+D) 600-200 MG-UNIT TABS Take 1 tablet by mouth daily.   Past Month at Unknown time  . cetirizine (ZYRTEC) 10 MG tablet Take 10 mg by mouth daily.   09/12/2016 at 0700  . cyanocobalamin 500 MCG tablet Take 500 mcg by mouth daily.   Taking  . esomeprazole (NEXIUM) 20 MG capsule Take 20 mg by mouth daily. Pt get OTC   09/12/2016 at 0700  . estradiol (ESTRACE) 0.1 MG/GM vaginal cream Place 1 Applicatorful vaginally as needed.   09/11/2016 at Unknown time  . hydrochlorothiazide (HYDRODIURIL) 25 MG tablet Take 25 mg by mouth daily.   09/12/2016 at 0700  . metoprolol tartrate (LOPRESSOR) 25 MG tablet Take 12.5 mg by mouth 2 (two) times daily.   09/12/2016 at 0700  . naproxen sodium (ANAPROX) 220 MG tablet Take 220-440  mg by mouth daily as needed (Headache, pain).   Past Week at Unknown time  . Omega-3 Fatty Acids (FISH OIL) 1000 MG CAPS Take 1,000 mg by mouth daily.    Past Week at Unknown time  . pravastatin (PRAVACHOL) 20 MG tablet Take 20 mg by mouth at bedtime.    09/11/2016 at Unknown time  . fluticasone (FLONASE) 50 MCG/ACT nasal spray 1 spray by Each Nare route daily.   More than a month at Unknown time  . metroNIDAZOLE (FLAGYL) 500 MG tablet Take 1 tablet (500 mg total) by mouth 2 (two) times daily. (Patient not taking: Reported on 08/29/2016) 14 tablet 0 Not Taking  . nitrofurantoin, macrocrystal-monohydrate, (MACROBID) 100 MG capsule Take 1 capsule (100 mg total) by mouth 2 (two) times daily. (Patient not taking: Reported on 08/27/2016) 14 capsule 0 Not Taking    Allergies  Allergen Reactions  . Sulfa Antibiotics    Social History:   reports that she has quit smoking. She has never used smokeless tobacco. She reports that she does not drink alcohol or use drugs. Family History  Problem Relation Age of Onset  . Cancer Mother   . Rheum arthritis Father   .  Migraines Daughter     Review of Systems: Noncontributory  PHYSICAL EXAM: Blood pressure (!) 158/62, pulse 83, temperature 98.8 F (37.1 C), temperature source Oral, resp. rate 16, SpO2 98 %. General appearance - alert, well appearing, and in no distress Chest - clear to auscultation, no wheezes, rales or rhonchi, symmetric air entry Heart - normal rate and regular rhythm Abdomen - soft, nontender, nondistended, no masses or organomegaly Pelvic - examination not indicated Extremities - peripheral pulses normal, no pedal edema, no clubbing or cyanosis  Labs: Results for orders placed or performed during the hospital encounter of 09/02/16 (from the past 336 hour(s))  CBC   Collection Time: 09/02/16 10:35 AM  Result Value Ref Range   WBC 4.8 4.0 - 10.5 K/uL   RBC 4.53 3.87 - 5.11 MIL/uL   Hemoglobin 13.0 12.0 - 15.0 g/dL   HCT 29.5 62.1 -  30.8 %   MCV 85.0 78.0 - 100.0 fL   MCH 28.7 26.0 - 34.0 pg   MCHC 33.8 30.0 - 36.0 g/dL   RDW 65.7 84.6 - 96.2 %   Platelets 196 150 - 400 K/uL  Comprehensive metabolic panel   Collection Time: 09/02/16 10:35 AM  Result Value Ref Range   Sodium 134 (L) 135 - 145 mmol/L   Potassium 3.6 3.5 - 5.1 mmol/L   Chloride 98 (L) 101 - 111 mmol/L   CO2 30 22 - 32 mmol/L   Glucose, Bld 94 65 - 99 mg/dL   BUN 13 6 - 20 mg/dL   Creatinine, Ser 9.52 0.44 - 1.00 mg/dL   Calcium 9.4 8.9 - 84.1 mg/dL   Total Protein 7.5 6.5 - 8.1 g/dL   Albumin 4.0 3.5 - 5.0 g/dL   AST 17 15 - 41 U/L   ALT 16 14 - 54 U/L   Alkaline Phosphatase 76 38 - 126 U/L   Total Bilirubin 0.6 0.3 - 1.2 mg/dL   GFR calc non Af Amer >60 >60 mL/min   GFR calc Af Amer >60 >60 mL/min   Anion gap 6 5 - 15  Type and screen [facility]   Collection Time: 09/02/16 10:35 AM  Result Value Ref Range   ABO/RH(D) O POS    Antibody Screen NEG    Sample Expiration 09/16/2016    Extend sample reason NO TRANSFUSIONS OR PREGNANCY IN THE PAST 3 MONTHS   ABO/Rh   Collection Time: 09/02/16 10:35 AM  Result Value Ref Range   ABO/RH(D) O POS     Imaging Studies: No results found.  Assessment: Patient Active Problem List   Diagnosis Date Noted  . Breast cancer screening, high risk patient 10/11/2015  . Urinary, incontinence, stress female 12/05/2011  . Prolapse of female pelvic organs 12/05/2011    Plan: Patient will undergo surgical management with total vaginal hysterectomy with anterior repair.   The risks of surgery were discussed in detail with the patient including but not limited to: bleeding which may require transfusion or reoperation; infection which may require antibiotics; injury to surrounding organs which may involve bowel, bladder, ureters ; need for additional procedures including laparoscopy or laparotomy; thromboembolic phenomenon, surgical site problems and other postoperative/anesthesia  complications. Likelihood of success in alleviating the patient's condition was discussed. Routine postoperative instructions will be reviewed with the patient and her family in detail after surgery.  The patient concurred with the proposed plan, giving informed written consent for the surgery.  Patient has been NPO since last night she will remain  NPO for procedure.  Anesthesia and OR aware.  Preoperative prophylactic antibiotics and SCDs ordered on call to the OR.  To OR when ready.  Jesalyn Finazzo L. Erin FullingHarraway-Smith, M.D., [facility] 09/12/2016 12:50 PM

## 2016-09-12 NOTE — Op Note (Signed)
09/12/2016  3:24 PM  PATIENT:  Brenda Mccullough  71 y.o. female  PRE-OPERATIVE DIAGNOSIS:  Cystocele, rectocele Cystocele  POST-OPERATIVE DIAGNOSIS:  same   PROCEDURE:  ANTERIOR AND POSTERIOR REPAIRS, PERINEOPLASTY  SURGEON:  Surgeon(s) and Role:      * Devontaye Ground, Leanora IvanoffMyra C, MD    ASSISTANTS: Willodean Rosenthalarolyn Harraway-Smith   ANESTHESIA:   local and general  EBL:  Total I/O In: 1000 [I.V.:1000] Out: 1000 [Urine:800; Blood:200]  BLOOD ADMINISTERED:none  DRAINS: none   LOCAL MEDICATIONS USED:  MARCAINE     SPECIMEN:  No Specimen  DISPOSITION OF SPECIMEN:  N/A  COUNTS:  YES  TOURNIQUET:  * No tourniquets in log *  DICTATION: .Dragon Dictation  PLAN OF CARE: Admit for overnight observation  PATIENT DISPOSITION:  PACU - hemodynamically stable.   Delay start of Pharmacological VTE agent (>24hrs) due to surgical blood loss or risk of bleeding: not applicable  Once the vaginal hysterectomy, BSO was finished and the peritoneum was closed in a purse string fashion, I proceeded with the anterior repair. A mixture of 100 cc of saline and 30 cc of 0.5%marcaine was injected in the SQ space for hydrodissection. I made an incision in the middle of the cuff and undermined the anterior vaginal mucosa. T clamps were used. Once I nearly reached the urethra, I did a Kelly plication. 2-0 vicryl suture is used throughout this case. I then closed the defect with interrupted sutures. I excised the excess vaginal tissue. I closed the mucosal edges in a running locking fashion. Excellent hemostasis was noted. I then proceeded with the posterior repair. I removed a triangular piece of skin on her perineum. I then injected the solution for hydrosecction into the posterior vaginal mucosa. I undermined the mucosa up to the vaginal cuff. I closed the defect with interrupted sutures. I then incorporated the tagged and held uterosacral ligament into the vaginal cuff on the right. The left suture had been cut earlier. I  closed the mucosal edges in a running locking fashion. I did a perineoplasty, yielding excellent cosmetic results. Hemostasis was noted throughout. I packed the vagina with estrogen cream on packing. She was extubated and taken to the recovery room in stable condition.

## 2016-09-12 NOTE — Transfer of Care (Signed)
Immediate Anesthesia Transfer of Care Note  Patient: Brenda Mccullough  Procedure(s) Performed: Procedure(s): HYSTERECTOMY VAGINAL (N/A) ANTERIOR REPAIR (CYSTOCELE) (N/A) BILATERAL SALPINGO OOPHORECTOMY (Bilateral) POSTERIOR REPAIR (RECTOCELE) AND PERINEORRHAPHY  Patient Location: PACU  Anesthesia Type:General  Level of Consciousness: awake, alert  and oriented  Airway & Oxygen Therapy: Patient Spontanous Breathing and Patient connected to nasal cannula oxygen  Post-op Assessment: Report given to RN and Post -op Vital signs reviewed and stable  Post vital signs: Reviewed and stable  Last Vitals:  Vitals:   09/12/16 1237 09/12/16 1241  BP:  (!) 158/62  Pulse: 83   Resp: 16   Temp: 37.1 C     Last Pain:  Vitals:   09/12/16 1237  TempSrc: Oral      Patients Stated Pain Goal: 3 (09/12/16 1241)  Complications: No apparent anesthesia complications

## 2016-09-13 ENCOUNTER — Encounter (HOSPITAL_COMMUNITY): Payer: Self-pay | Admitting: Obstetrics & Gynecology

## 2016-09-13 DIAGNOSIS — N814 Uterovaginal prolapse, unspecified: Secondary | ICD-10-CM | POA: Diagnosis not present

## 2016-09-13 LAB — BASIC METABOLIC PANEL
Anion gap: 6 (ref 5–15)
BUN: 10 mg/dL (ref 6–20)
CALCIUM: 8.6 mg/dL — AB (ref 8.9–10.3)
CO2: 26 mmol/L (ref 22–32)
Chloride: 96 mmol/L — ABNORMAL LOW (ref 101–111)
Creatinine, Ser: 0.69 mg/dL (ref 0.44–1.00)
GFR calc Af Amer: >60 mL/min (ref >60)
GLUCOSE: 164 mg/dL — AB (ref 65–99)
POTASSIUM: 4.4 mmol/L (ref 3.5–5.1)
SODIUM: 128 mmol/L — AB (ref 135–145)

## 2016-09-13 LAB — CBC
HCT: 32.8 % — ABNORMAL LOW (ref 36.0–46.0)
Hemoglobin: 11.3 g/dL — ABNORMAL LOW (ref 12.0–15.0)
MCH: 29 pg (ref 26.0–34.0)
MCHC: 34.5 g/dL (ref 30.0–36.0)
MCV: 84.1 fL (ref 78.0–100.0)
PLATELETS: 164 10*3/uL (ref 150–400)
RBC: 3.9 MIL/uL (ref 3.87–5.11)
RDW: 13.8 % (ref 11.5–15.5)
WBC: 14.7 10*3/uL — AB (ref 4.0–10.5)

## 2016-09-13 MED ORDER — DOCUSATE SODIUM 100 MG PO CAPS: 100.0000 mg | ORAL_CAPSULE | Freq: Two times a day (BID) | ORAL | 0 refills | Status: DC

## 2016-09-13 MED ORDER — HYDROCODONE-ACETAMINOPHEN 5-325 MG PO TABS: 1.0000 | ORAL_TABLET | Freq: Four times a day (QID) | ORAL | 0 refills | Status: DC | PRN

## 2016-09-13 MED ORDER — FLAVOXATE HCL 100 MG PO TABS
100.0000 mg | ORAL_TABLET | Freq: Three times a day (TID) | ORAL | Status: DC
  Administered 2016-09-13 (×2): 100 mg via ORAL
  Filled 2016-09-13 (×4): qty 1

## 2016-09-13 MED ORDER — FLAVOXATE HCL 100 MG PO TABS: 100.0000 mg | ORAL_TABLET | Freq: Three times a day (TID) | ORAL | 0 refills | Status: DC

## 2016-09-13 MED ORDER — NAPROXEN 500 MG PO TABS: 500.0000 mg | ORAL_TABLET | Freq: Three times a day (TID) | ORAL | 0 refills | Status: DC

## 2016-09-13 NOTE — Progress Notes (Signed)
Pt discharged to home with her 3 daughters.  Discharge instructions reviewed with all 4 together.  Catheter care reviewed in detail including changing between leg and large drainage bags, emptying both bags, attaching both bags to thigh, cleaning the catheter daily with soap and water, and keeping bag below level of bladder.  Pt home with stat lock in place and instructions on how to remove using rubbing alcohol if she notices any skin irritation (none noted during hospitalization).  Also sent home with a leg strap as an alternative to the stat lock and she and daughters were shown how to attach catheter to it.  Pt chose to wear large drainage bag home.  Daughter, Marcelino DusterMichelle, performed return demonstration for skills mentioned above.  Pt to car via wheelchair with Filiberto PinksS. Rothe, NT.  Pt home with abdominal binder per order, although she did not want to wear it at this time.  No other equipment for home ordered at discharge.

## 2016-09-13 NOTE — Addendum Note (Signed)
Addendum  created 09/13/16 0729 by Earmon PhoenixWilkerson, Kaliana Albino P, CRNA   Sign clinical note

## 2016-09-13 NOTE — Discharge Summary (Signed)
Physician Discharge Summary  Patient ID: Brenda Mccullough MRN: [id number] DOB/AGE: Dec 01, 1945 71 y.o.  Admit date: 09/12/2016 Discharge date: 09/13/2016  Admission Diagnoses: prolapse  Discharge Diagnoses:  Principal Problem:   Prolapse of female pelvic organs Active Problems:   Post-operative state   Discharged Condition: good  Hospital Course: Patient had an uncomplicated surgery; for further details of this surgery, please refer to the operative note. Furthermore, the patient had an uncomplicated postoperative course.  By time of discharge, her pain was controlled on oral pain medications; she was ambulating, tolerating regular diet and passing flatus.  She will be discharged with the Foley bag in place. She was instructed its care. She was deemed stable for discharge to home.    Consults: None  Significant Diagnostic Studies: labs: CBC, BMP  Treatments: surgery: TVH with BSO and A&P repair with perineorrhaphy    Discharge Exam: Blood pressure (!) 127/51, pulse 68, temperature 97.6 F (36.4 C), temperature source Oral, resp. rate 16, height 5\' 2"  (1.575 m), weight 158 lb 8 oz (71.9 kg), SpO2 98 %. General appearance: alert and no distress Resp: clear to auscultation bilaterally Cardio: regular rate and rhythm, S1, S2 normal, no murmur, click, rub or gallop GI: soft, non-tender; bowel sounds normal; no masses,  no organomegaly Extremities: extremities normal, atraumatic, no cyanosis or edema  Disposition: 01-Home or Self Care  Discharge Instructions    Call MD for:  difficulty breathing, headache or visual disturbances    Complete by:  As directed    Call MD for:  extreme fatigue    Complete by:  As directed    Call MD for:  hives    Complete by:  As directed    Call MD for:  persistant dizziness or light-headedness    Complete by:  As directed    Call MD for:  persistant nausea and vomiting    Complete by:  As directed    Call MD for:  redness, tenderness, or signs of  infection (pain, swelling, redness, odor or green/yellow discharge around incision site)    Complete by:  As directed    Call MD for:  severe uncontrolled pain    Complete by:  As directed    Call MD for:  temperature >100.4    Complete by:  As directed    Diet - low sodium heart healthy    Complete by:  As directed    Driving Restrictions    Complete by:  As directed    No driving for 2 weeks   Increase activity slowly    Complete by:  As directed    Lifting restrictions    Complete by:  As directed    No heavy lifting   Sexual Activity Restrictions    Complete by:  As directed    Nothing in vagina for 6 weeks     Allergies as of 09/13/2016      Reactions   Sulfa Antibiotics       Medication List    STOP taking these medications   estradiol 0.1 MG/GM vaginal cream Commonly known as:  ESTRACE   naproxen sodium 220 MG tablet Commonly known as:  ANAPROX     TAKE these medications   amitriptyline 25 MG tablet Commonly known as:  ELAVIL Take 1 tablet (25 mg total) by mouth at bedtime. What changed:  how much to take  when to take this  reasons to take this   amLODipine 10 MG tablet Commonly known as:  NORVASC Take 10 mg by mouth daily.   aspirin EC 81 MG tablet Take 81 mg by mouth daily.   CALCIUM 600+D 600-200 MG-UNIT Tabs Generic drug:  Calcium Carbonate-Vitamin D Take 1 tablet by mouth daily.   cetirizine 10 MG tablet Commonly known as:  ZYRTEC Take 10 mg by mouth daily.   cyanocobalamin 500 MCG tablet Take 500 mcg by mouth daily.   docusate sodium 100 MG capsule Commonly known as:  COLACE Take 1 capsule (100 mg total) by mouth 2 (two) times daily.   esomeprazole 20 MG capsule Commonly known as:  NEXIUM Take 20 mg by mouth daily. Pt get OTC   Fish Oil 1000 MG Caps Take 1,000 mg by mouth daily.   flavoxATE 100 MG tablet Commonly known as:  URISPAS Take 1 tablet (100 mg total) by mouth 3 (three) times daily.   fluticasone 50 MCG/ACT nasal  spray Commonly known as:  FLONASE 1 spray by Each Nare route daily.   hydrochlorothiazide 25 MG tablet Commonly known as:  HYDRODIURIL Take 25 mg by mouth daily.   HYDROcodone-acetaminophen 5-325 MG tablet Commonly known as:  NORCO/VICODIN Take 1 tablet by mouth every 6 (six) hours as needed.   metoprolol tartrate 25 MG tablet Commonly known as:  LOPRESSOR Take 12.5 mg by mouth 2 (two) times daily.   naproxen 500 MG tablet Commonly known as:  NAPROSYN Take 1 tablet (500 mg total) by mouth 3 (three) times daily with meals. As needed for pain   pravastatin 20 MG tablet Commonly known as:  PRAVACHOL Take 20 mg by mouth at bedtime.      Follow-up Information    [facility] Follow up on 09/18/2016.   Specialty:  Obstetrics and Gynecology Why:  Dr. Erin FullingHarraway-Smith @1pm   Contact information: [address] [place] [phone]          Signed: Willodean RosenthalCarolyn Harraway-Smith 09/13/2016, 3:04 PM

## 2016-09-13 NOTE — Anesthesia Postprocedure Evaluation (Signed)
Anesthesia Post Note  Patient: Garald BraverDorothy W Mincey  Procedure(s) Performed: Procedure(s) (LRB): HYSTERECTOMY VAGINAL (N/A) ANTERIOR REPAIR (CYSTOCELE) (N/A) BILATERAL SALPINGO OOPHORECTOMY (Bilateral) POSTERIOR REPAIR (RECTOCELE) AND PERINEORRHAPHY     Patient location during evaluation: [facility] Anesthesia Type: General Level of consciousness: awake Pain management: pain level controlled Vital Signs Assessment: post-procedure vital signs reviewed and stable Respiratory status: spontaneous breathing Cardiovascular status: stable Postop Assessment: no signs of nausea or vomiting and adequate PO intake Anesthetic complications: no    Last Vitals:  Vitals:   09/13/16 0430 09/13/16 0542  BP: (!) 118/51   Pulse: 73   Resp: 16 15  Temp: 36.6 C     Last Pain:  Vitals:   09/13/16 0542  TempSrc:   PainSc: 0-No pain   Pain Goal: Patients Stated Pain Goal: 4 (09/13/16 0000)               Edison PaceWILKERSON,Trasean Delima

## 2016-09-13 NOTE — Discharge Instructions (Signed)
Vaginal Hysterectomy, Care After °Refer to this sheet in the next few weeks. These instructions provide you with information about caring for yourself after your procedure. Your health care provider may also give you more specific instructions. Your treatment has been planned according to current medical practices, but problems sometimes occur. Call your health care provider if you have any problems or questions after your procedure. °What can I expect after the procedure? °After the procedure, it is common to have: °· Pain. °· Soreness and numbness in your incision areas. °· Vaginal bleeding and discharge. °· Constipation. °· Temporary problems emptying the bladder. °· Feelings of sadness or other emotions. ° °Follow these instructions at home: °Medicines °· Take over-the-counter and prescription medicines only as told by your health care provider. °· If you were prescribed an antibiotic medicine, take it as told by your health care provider. Do not stop taking the antibiotic even if you start to feel better. °· Do not drive or operate heavy machinery while taking prescription pain medicine. °Activity °· Return to your normal activities as told by your health care provider. Ask your health care provider what activities are safe for you. °· Get regular exercise as told by your health care provider. You may be told to take short walks every day and go farther each time. °· Do not lift anything that is heavier than 10 lb (4.5 kg). °General instructions ° °· Do not put anything in your vagina for 6 weeks after your surgery or as told by your health care provider. This includes tampons and douches. °· Do not have sex until your health care provider says you can. °· Do not take baths, swim, or use a hot tub until your health care provider approves. °· Drink enough fluid to keep your urine clear or pale yellow. °· Do not drive for 24 hours if you were given a sedative. °· Keep all follow-up visits as told by your health  care provider. This is important. °Contact a health care provider if: °· Your pain medicine is not helping. °· You have a fever. °· You have redness, swelling, or pain at your incision site. °· You have blood, pus, or a bad-smelling discharge from your vagina. °· You continue to have difficulty urinating. °Get help right away if: °· You have severe abdominal or back pain. °· You have heavy bleeding from your vagina. °· You have chest pain or shortness of breath. °This information is not intended to replace advice given to you by your health care provider. Make sure you discuss any questions you have with your health care provider. °Document Released: 06/05/2015 Document Revised: 07/20/2015 Document Reviewed: 02/26/2015 °Elsevier Interactive Patient Education © 2018 Elsevier Inc. ° °

## 2016-09-16 ENCOUNTER — Other Ambulatory Visit: Payer: Self-pay

## 2016-09-16 MED ORDER — PHENAZOPYRIDINE HCL 95 MG PO TABS: 95.0000 mg | ORAL_TABLET | Freq: Three times a day (TID) | ORAL | 1 refills | Status: DC | PRN

## 2016-09-16 NOTE — Progress Notes (Signed)
Patient called and unable to pick up uraspas due to prior authorization needed. Called Dr. Erin Fullingharraway smith and patient can have Pyridium three time a day for bladder spasms. New script placed to [facility]. Patient made aware. Brenda StammerJennifer Howard RNBSN

## 2016-09-18 ENCOUNTER — Ambulatory Visit (INDEPENDENT_AMBULATORY_CARE_PROVIDER_SITE_OTHER): Payer: Self-pay | Admitting: Obstetrics & Gynecology

## 2016-09-18 ENCOUNTER — Encounter: Payer: Self-pay | Admitting: Obstetrics & Gynecology

## 2016-09-18 VITALS — BP 115/98 | HR 86 | Temp 97.9°F | Wt 159.0 lb

## 2016-09-18 DIAGNOSIS — Z9889 Other specified postprocedural states: Secondary | ICD-10-CM

## 2016-09-18 NOTE — Progress Notes (Signed)
History:  [age] y.o. G3P3003 here today for 1 week post op check. Pt is s/p TVH with BSO and A&P repair. Pt reports taking Naproxen 1-2 times since she went home otherwise has required no pain meds. She was not able to get the [place] but, started Pyridium 2 days ago. She reports that she feels better since starting that med  She removed the Foley this am and  Reports that she has voided 3 times since then without difficulty.     The following portions of the patient's history were reviewed and updated as appropriate: allergies, current medications, past family history, past medical history, past social history, past surgical history and problem list.  Review of Systems:  Pertinent items are noted in HPI.   Objective:  Physical Exam Blood pressure (!) 115/98, pulse 86, temperature 97.9 F (36.6 C), temperature source Oral, weight 159 lb (72.1 kg).  CONSTITUTIONAL: Well-developed, well-nourished female in no acute distress.  HENT:  Normocephalic, atraumatic EYES: Conjunctivae and EOM are normal. No scleral icterus.  NECK: Normal range of motion SKIN: Skin is warm and dry. No rash noted. Not diaphoretic.No pallor. NEUROLGIC: Alert and oriented to person, place, and time. Normal coordination.  Abd: Soft, nontender and nondistended Pelvic: Normal appearing external genitalia; Internal exam not done    Post void residual was <100cc  Labs and Imaging 09/12/2016 Uterus, ovaries and fallopian tubes, and cervix - CERVIX: CHRONIC INFLAMMATION. - ENDOMETRIUM: ATROPHIC. - MYOMETRIUM: VASCULAR CALCIFICATIONS. - SEROSA: UNREMARKABLE. - BILATERAL ADNEXA: BENIGN OVARIES AND FALLOPIAN TUBES WITH PARATUBAL CYST(S).  Assessment & Plan:  1 week post op check and PVR check  Pt doing will Will leave Foley out  Timed void at least once every 4-6 hours for 1-2 weeks Keep Pyridium for 1 week Miralax to keep stools to daily F/u in 4 weeks Gradual increase in activities.  RTW in 6 weeks  Caydence Koenig L.  Harraway-Smith, M.D., Evern CoreFACOG

## 2016-09-18 NOTE — Patient Instructions (Signed)
Vaginal Hysterectomy, Care After °Refer to this sheet in the next few weeks. These instructions provide you with information about caring for yourself after your procedure. Your health care provider may also give you more specific instructions. Your treatment has been planned according to current medical practices, but problems sometimes occur. Call your health care provider if you have any problems or questions after your procedure. °What can I expect after the procedure? °After the procedure, it is common to have: °· Pain. °· Soreness and numbness in your incision areas. °· Vaginal bleeding and discharge. °· Constipation. °· Temporary problems emptying the bladder. °· Feelings of sadness or other emotions. ° °Follow these instructions at home: °Medicines °· Take over-the-counter and prescription medicines only as told by your health care provider. °· If you were prescribed an antibiotic medicine, take it as told by your health care provider. Do not stop taking the antibiotic even if you start to feel better. °· Do not drive or operate heavy machinery while taking prescription pain medicine. °Activity °· Return to your normal activities as told by your health care provider. Ask your health care provider what activities are safe for you. °· Get regular exercise as told by your health care provider. You may be told to take short walks every day and go farther each time. °· Do not lift anything that is heavier than 10 lb (4.5 kg). °General instructions ° °· Do not put anything in your vagina for 6 weeks after your surgery or as told by your health care provider. This includes tampons and douches. °· Do not have sex until your health care provider says you can. °· Do not take baths, swim, or use a hot tub until your health care provider approves. °· Drink enough fluid to keep your urine clear or pale yellow. °· Do not drive for 24 hours if you were given a sedative. °· Keep all follow-up visits as told by your health  care provider. This is important. °Contact a health care provider if: °· Your pain medicine is not helping. °· You have a fever. °· You have redness, swelling, or pain at your incision site. °· You have blood, pus, or a bad-smelling discharge from your vagina. °· You continue to have difficulty urinating. °Get help right away if: °· You have severe abdominal or back pain. °· You have heavy bleeding from your vagina. °· You have chest pain or shortness of breath. °This information is not intended to replace advice given to you by your health care provider. Make sure you discuss any questions you have with your health care provider. °Document Released: 06/05/2015 Document Revised: 07/20/2015 Document Reviewed: 02/26/2015 °Elsevier Interactive Patient Education © 2018 Elsevier Inc. ° °

## 2016-10-04 ENCOUNTER — Ambulatory Visit (INDEPENDENT_AMBULATORY_CARE_PROVIDER_SITE_OTHER): Payer: Medicare Other | Admitting: Obstetrics & Gynecology

## 2016-10-04 ENCOUNTER — Encounter: Payer: Medicare Other | Admitting: Obstetrics & Gynecology

## 2016-10-04 ENCOUNTER — Encounter: Payer: Self-pay | Admitting: Obstetrics & Gynecology

## 2016-10-04 VITALS — BP 148/61 | HR 77 | Wt 160.0 lb

## 2016-10-04 DIAGNOSIS — Z9889 Other specified postprocedural states: Secondary | ICD-10-CM

## 2016-10-04 DIAGNOSIS — L02215 Cutaneous abscess of perineum: Secondary | ICD-10-CM

## 2016-10-04 MED ORDER — AMOXICILLIN-POT CLAVULANATE 875-125 MG PO TABS: 1.0000 | ORAL_TABLET | Freq: Two times a day (BID) | ORAL | 1 refills | Status: DC

## 2016-10-04 MED ORDER — OMEPRAZOLE 20 MG PO TBEC: 1.0000 | DELAYED_RELEASE_TABLET | Freq: Every day | ORAL | 3 refills | Status: AC

## 2016-10-04 MED ORDER — METRONIDAZOLE 500 MG PO TABS: 500.0000 mg | ORAL_TABLET | Freq: Two times a day (BID) | ORAL | 0 refills | Status: DC

## 2016-10-04 NOTE — Progress Notes (Signed)
History:  71 y.o. G3P3003 here today for eval pof pain in her perineum near her buttocks that began 2-3 days prev. She reports that her pain was better but now she c/o pain on the right buttocks with walking or sitting. She reports that it feels better when she places a hot compress there. She denies fever or chills or vaginal drainage of fluid. She is 3 weeks post op from a TVH with A&P repair.  The following portions of the patient's history were reviewed and updated as appropriate: allergies, current medications, past family history, past medical history, past social history, past surgical history and problem list.  Review of Systems:  Pertinent items are noted in HPI.   Objective:  Physical Exam Blood pressure (!) 148/61, pulse 77, weight 160 lb (72.6 kg).  CONSTITUTIONAL: Well-developed, well-nourished female in no acute distress.  HENT:  Normocephalic, atraumatic EYES: Conjunctivae and EOM are normal. No scleral icterus.  NECK: Normal range of motion SKIN: Skin is warm and dry. No rash noted. Not diaphoretic.No pallor. NEUROLGIC: Alert and oriented to person, place, and time. Normal coordination.  Abd: Soft, nontender and nondistended Pelvic: There is a draining perineal abscess on the right side at the base of the perineum NOT assoc with the suture lines There is a palpable 3 cm non fluctuant mass. With compression a small of purulent drainage is noted.  Well healing vaginal mucosa. Normal discharge.    Labs and Imaging No results found.  Assessment & Plan:  Perineal abscess no s/sx of systemic infection  Flagyl 500mg  bid x 10 day  Augmentin 875mg  bid x 10 day  F/u in  Week or sooner prn for F/C/ increasing pain or size of the abscess  Roshon Duell L. Harraway-Smith, M.D., Evern CoreFACOG

## 2016-10-07 ENCOUNTER — Telehealth: Payer: Self-pay

## 2016-10-07 NOTE — Telephone Encounter (Signed)
Discussed with Dr. Erin Fullingharraway Smith. Patient feels like her wound is LESS painful and healing well. Patient instructed per Dr. Erin FullingHarraway Smith she could break the Augmentin in 1/2 and take 1/2 tablet three times a day. Patient states understanding and plans to keep appointment Wednesday. Instructed to call back if her symptoms do not improve as [facility] Patient states understanding. Armandina StammerJennifer Standley Bargo RNBSN

## 2016-10-07 NOTE — Telephone Encounter (Signed)
Patient states that she is feeling nauseous and light headed. Patient states she is currently taking both antibiotics and just feels lightheaded after taking them. Patient has follow up scheduled for this Wednesday but wondering if she should reduce the amount of antibiotics she is taking.   Will route to provider for input on plan of care. Armandina StammerJennifer Howard RNBSN

## 2016-10-09 ENCOUNTER — Encounter: Payer: Self-pay | Admitting: Obstetrics & Gynecology

## 2016-10-09 ENCOUNTER — Ambulatory Visit (INDEPENDENT_AMBULATORY_CARE_PROVIDER_SITE_OTHER): Payer: Self-pay | Admitting: Obstetrics & Gynecology

## 2016-10-09 VITALS — BP 163/66 | HR 98 | Ht 62.0 in | Wt 161.0 lb

## 2016-10-09 DIAGNOSIS — Z9889 Other specified postprocedural states: Secondary | ICD-10-CM

## 2016-10-09 NOTE — Progress Notes (Signed)
History:  71 y.o. G3P3003 here today for post op f/u.   She reports that she has been taking atbx without difficulty now and feels back to normal and wants to know when she can go back to work. She denies pain.  She denies leakage of urine.    The following portions of the patient's history were reviewed and updated as appropriate: allergies, current medications, past family history, past medical history, past social history, past surgical history and problem list.  Review of Systems:  Pertinent items are noted in HPI.   Objective:  Physical Exam Blood pressure (!) 163/66, pulse 98, height 5\' 2"  (1.575 m), weight 161 lb (73 kg).  CONSTITUTIONAL: Well-developed, well-nourished female in no acute distress.  HENT:  Normocephalic, atraumatic EYES: Conjunctivae and EOM are normal. No scleral icterus.  NECK: Normal range of motion SKIN: Skin is warm and dry. No rash noted. Not diaphoretic.No pallor. NEUROLGIC: Alert and oriented to person, place, and time. Normal coordination.  Abd: Soft, nontender and nondistended Pelvic: Normal appearing external genitalia; normal appearing vaginal mucosa which is healing well. There is an area of raw edges on the post vagina this was treated with silver nitrate.Normal discharge.  No palpable masses or adnexal tenderness  Labs and Imaging No results found.  Assessment & Plan:  4 weeks post op check doing well Gradual return to full activities F/u in 2 weeks   Cont complete course of atbx  Cloyce Paterson L. Harraway-Smith, M.D., Evern CoreFACOG

## 2016-10-10 ENCOUNTER — Encounter: Payer: Self-pay | Admitting: Obstetrics & Gynecology

## 2016-10-21 ENCOUNTER — Ambulatory Visit (INDEPENDENT_AMBULATORY_CARE_PROVIDER_SITE_OTHER): Payer: Self-pay | Admitting: Obstetrics & Gynecology

## 2016-10-21 ENCOUNTER — Encounter: Payer: Self-pay | Admitting: Obstetrics & Gynecology

## 2016-10-21 VITALS — BP 145/58 | HR 75 | Ht 62.0 in | Wt 162.0 lb

## 2016-10-21 DIAGNOSIS — Z9889 Other specified postprocedural states: Secondary | ICD-10-CM

## 2016-10-21 NOTE — Progress Notes (Signed)
History:  71 y.o. G3P3003 here today for 6 week post op check. Pt reports that she is doing well. She has no vaginal discharg or bleeding and has no pain. She is voiding and passing stools without difficulty.        The following portions of the patient's history were reviewed and updated as appropriate: allergies, current medications, past family history, past medical history, past social history, past surgical history and problem list.  Review of Systems:  Pertinent items are noted in HPI.   Objective:  Physical Exam Blood pressure (!) 145/58, pulse 75, height 5\' 2"  (1.575 m), weight 162 lb (73.5 kg).  CONSTITUTIONAL: Well-developed, well-nourished female in no acute distress.  HENT:  Normocephalic, atraumatic EYES: Conjunctivae and EOM are normal. No scleral icterus.  NECK: Normal range of motion SKIN: Skin is warm and dry. No rash noted. Not diaphoretic.No pallor. NEUROLGIC: Alert and oriented to person, place, and time. Normal coordination.  Abd: Soft, nontender and nondistended Pelvic: Normal appearing external genitalia; normal appearing vaginal mucosa and vaginal cuff.  Normal discharge.  No other palpable masses or adnexal tenderness   Assessment & Plan:  6 week post op check- doing well  F/u in 3 months or sooner prn May RTW Gradual return to full activities  Mkenzie Dotts L. Harraway-Smith, M.D., Evern Core

## 2017-01-20 ENCOUNTER — Ambulatory Visit (INDEPENDENT_AMBULATORY_CARE_PROVIDER_SITE_OTHER): Payer: Medicare Other | Admitting: Obstetrics & Gynecology

## 2017-01-20 ENCOUNTER — Encounter: Payer: Self-pay | Admitting: Obstetrics & Gynecology

## 2017-01-20 VITALS — BP 133/56 | HR 75 | Ht 62.0 in | Wt 161.0 lb

## 2017-01-20 DIAGNOSIS — R151 Fecal smearing: Secondary | ICD-10-CM

## 2017-01-20 NOTE — Progress Notes (Signed)
History:  71 y.o. G3P3003 here today for 3 month post op check . She is doing well however, she reports fecal incontinence x 2. She thinks it was 2 months prev. She denies leakage of urine and denies recent sx. She reports that she is glad that she had the surgery.    The following portions of the patient's history were reviewed and updated as appropriate: allergies, current medications, past family history, past medical history, past social history, past surgical history and problem list.  Review of Systems:  Pertinent items are noted in HPI.   Objective:  Physical Exam Blood pressure (!) 133/56, pulse 75, height 5\' 2"  (1.575 m), weight 161 lb (73 kg).  CONSTITUTIONAL: Well-developed, well-nourished female in no acute distress.  HENT:  Normocephalic, atraumatic EYES: Conjunctivae and EOM are normal. No scleral icterus.  NECK: Normal range of motion SKIN: Skin is warm and dry. No rash noted. Not diaphoretic.No pallor. NEUROLGIC: Alert and oriented to person, place, and time. Normal coordination.  Abd: Soft, nontender and nondistended Pelvic: Normal appearing external genitalia; normal appearing vaginal mucosa. Cuff well healed. No prolapse.  Normal discharge.  Anal sphincter intact. Good tone  Assessment & Plan:  Fecal incontinence- improved. I suspect that her sx were from post op nerve damage   F/u in 3-6 months if sx persist  F/u sooner prn  Total face-to-face time with patient was 15 min.  Greater than 50% was spent in counseling and coordination of care with the patient.   Maysen Sudol L. Harraway-Smith, M.D., Evern CoreFACOG

## 2017-04-16 ENCOUNTER — Ambulatory Visit: Payer: Medicare Other | Admitting: Podiatry

## 2017-04-16 DIAGNOSIS — M2012 Hallux valgus (acquired), left foot: Secondary | ICD-10-CM

## 2017-04-16 DIAGNOSIS — M2042 Other hammer toe(s) (acquired), left foot: Secondary | ICD-10-CM

## 2017-04-16 DIAGNOSIS — M79672 Pain in left foot: Secondary | ICD-10-CM

## 2017-04-16 DIAGNOSIS — M21619 Bunion of unspecified foot: Secondary | ICD-10-CM | POA: Diagnosis not present

## 2017-04-16 NOTE — Progress Notes (Signed)
SUBJECTIVE: 72 y.o. year old female presents for painful hammer toe 2nd right.  Stated that she has tried her own remedy tapping and removing corn but noted the lesion is coming back. Patient request the problem to be fixed for good.   Review of Systems  Constitutional: Negative.   HENT: Negative.   Eyes: Negative.   Cardiovascular:       Mitral valve prolapse.  Gastrointestinal: Negative.   Genitourinary: Negative.   Musculoskeletal:       Arthritis in hands.  Skin: Negative.      OBJECTIVE: DERMATOLOGIC EXAMINATION: Digital corn 2nd right, painful.  VASCULAR EXAMINATION OF LOWER LIMBS: All pedal pulses are palpable with normal pulsation.  No edema or erythema noted. Temperature gradient from tibial crest to dorsum of foot is within normal bilateral.  NEUROLOGIC EXAMINATION OF THE LOWER LIMBS: Achilles DTR is present and within normal. Monofilament (Semmes-Weinstein 10-gm) sensory testing positive 6 out of 6, bilateral. Vibratory sensations(128Hz  turning fork) intact at medial and lateral forefoot bilateral.  Sharp and Dull discriminatory sensations at the plantar ball of hallux is intact bilateral.   MUSCULOSKELETAL EXAMINATION: Hallux valgus with bunion right. Overlapping 1st and 2nd toe right.  RADIOGRAPHIC STUDIES of the Right Foot:  AP View:  Severe hallux valgus, enlarged medial eminence first metatarsal head, fibular sesamoid bone at position 7. Close approximation of the first and 2nd toe with weight bearing. Lateral view:  Spurs present: Plantar calcaneal spur. Normal Cyma line, Mild first ray elevation on right foot.  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  PLAN: Reviewed findings and available treatment options, tapping, open toed shoes, debridement. Patient request the toe be fixed so that it will lay down flat while in closed in shoes. [facility] Hammer toe repair  2nd right foot.

## 2017-04-17 ENCOUNTER — Encounter: Payer: Self-pay | Admitting: Podiatry

## 2017-04-17 NOTE — Patient Instructions (Signed)
Seen for painful toe right foot. Reviewed findings, bunion, overlapping toes and available treatment options. As per request [facility] toe repair 2nd left.

## 2017-04-18 ENCOUNTER — Telehealth: Payer: Self-pay | Admitting: *Deleted

## 2017-04-18 NOTE — Telephone Encounter (Signed)
Called insurance carrier for surgery benefits for CPT codes [id number], [phone]. No authorization is required. There is no deductible. The facility fee is $275. The plan pays 100% after copay of $275.  Left message for patient to call back to go over benefits

## 2017-04-21 NOTE — Telephone Encounter (Signed)
Patient was informed of surgery benefits. Patient does not want to schedule surgery at this time. She would like to have the surgery in the future after she has taken care of some other things financially.

## 2017-07-03 ENCOUNTER — Encounter: Payer: Self-pay | Admitting: Obstetrics & Gynecology

## 2017-07-03 ENCOUNTER — Ambulatory Visit (INDEPENDENT_AMBULATORY_CARE_PROVIDER_SITE_OTHER): Payer: Medicare Other | Admitting: Obstetrics & Gynecology

## 2017-07-03 VITALS — BP 136/45 | HR 69 | Wt 163.0 lb

## 2017-07-03 DIAGNOSIS — N3000 Acute cystitis without hematuria: Secondary | ICD-10-CM

## 2017-07-03 DIAGNOSIS — R350 Frequency of micturition: Secondary | ICD-10-CM

## 2017-07-03 LAB — POCT URINALYSIS DIPSTICK
BILIRUBIN UA: NEGATIVE
KETONES UA: NEGATIVE
Nitrite, UA: NEGATIVE
PH UA: 6 (ref 5.0–8.0)
Protein, UA: NEGATIVE
RBC UA: NEGATIVE
SPEC GRAV UA: 1.015 (ref 1.010–1.025)

## 2017-07-03 MED ORDER — CEPHALEXIN 500 MG PO CAPS: 500.0000 mg | ORAL_CAPSULE | Freq: Two times a day (BID) | ORAL | 0 refills | Status: DC

## 2017-07-03 NOTE — Progress Notes (Signed)
Pt c/o UTI symptoms. Pt is following up from 11/18 - pt had no complaints

## 2017-07-03 NOTE — Progress Notes (Signed)
History:  72 y.o. G3P3003 here today for eval of urinary freq and dysuria. She reports that this began last pm. She has had UTIs in the past and reports that it feels like that.  She denies fever or chills and has no hematuria.   She reports begin so happy that she got her surgery as she is much more mobile.   The following portions of the patient's history were reviewed and updated as appropriate: allergies, current medications, past family history, past medical history, past social history, past surgical history and problem list.  Review of Systems:  Pertinent items are noted in HPI.    Objective:  Physical Exam Blood pressure (!) 136/45, pulse 69, weight 163 lb (73.9 kg).  CONSTITUTIONAL: Well-developed, well-nourished female in no acute distress.  HENT:  Normocephalic, atraumatic EYES: Conjunctivae and EOM are normal. No scleral icterus.  NECK: Normal range of motion SKIN: Skin is warm and dry. No rash noted. Not diaphoretic.No pallor. NEUROLGIC: Alert and oriented to person, place, and time. Normal coordination.  Abd: Soft, nontender and nondistended; no suprapubic tenderness.  Pelvic: deferred  Labs and Imaging UA small leuks  Assessment & Plan:  UTI  Keflex  bid x5 days  F/u prn  Tannor Pyon L. Harraway-Smith, M.D., Evern Core

## 2017-07-03 NOTE — Patient Instructions (Signed)

## 2017-07-05 LAB — URINE CULTURE

## 2017-07-07 ENCOUNTER — Telehealth: Payer: Self-pay

## 2017-07-07 NOTE — Telephone Encounter (Signed)
Patient made aware that UTI is present but medications she was given will treat it. Patient to call back if symptoms not relieved. Armandina Stammer RN

## 2018-08-12 ENCOUNTER — Ambulatory Visit: Payer: Medicare Other | Admitting: Obstetrics & Gynecology

## 2018-08-24 ENCOUNTER — Ambulatory Visit: Payer: Medicare Other | Admitting: Obstetrics & Gynecology

## 2018-09-16 ENCOUNTER — Ambulatory Visit (INDEPENDENT_AMBULATORY_CARE_PROVIDER_SITE_OTHER): Payer: Medicare Other | Admitting: Obstetrics & Gynecology

## 2018-09-16 ENCOUNTER — Other Ambulatory Visit: Payer: Self-pay

## 2018-09-16 ENCOUNTER — Encounter: Payer: Self-pay | Admitting: Obstetrics & Gynecology

## 2018-09-16 VITALS — BP 144/60 | HR 78 | Ht 61.5 in | Wt 167.0 lb

## 2018-09-16 DIAGNOSIS — R159 Full incontinence of feces: Secondary | ICD-10-CM | POA: Diagnosis not present

## 2018-09-16 NOTE — Patient Instructions (Signed)
Fecal Incontinence Fecal incontinence, also called accidental bowel leakage, is not being able to control your bowels. This condition happens because the nerves or muscles around the anus do not work the way they should. This affects their ability to hold stool (feces). What are the causes? This condition may be caused by:  Damage to the muscles at the end of the rectum (sphincter).  Damage to the nerves that control bowel movements.  Diarrhea.  Chronic constipation.  Pelvic floor dysfunction. This means the muscles in the pelvis do not work well.  Loss of bowel storage capacity. This occurs when the rectum can no longer stretch in size in order to store feces.  Inflammatory bowel disease (IBD), such as Crohn's disease.  Irritable bowel syndrome (IBS). What increases the risk? You are more likely to develop this condition if you:  Were born with bowels or a pelvis that did not form correctly.  Have had rectal surgery.  Have had radiation treatment for certain cancers.  Have been pregnant, had a vaginal delivery, or had surgery that damaged the pelvic floor muscles.  Had a complicated childbirth, spinal cord injury, or other trauma that caused nerve damage.  Have a condition that can affect nerve function, such as diabetes, Parkinson's disease, or multiple sclerosis.  Have a condition where the rectum drops down into the anus or vagina (prolapse).  Are 65 years of age or older. What are the signs or symptoms? The main symptom of this condition is not being able to control your bowels. You also might not be able to get to the bathroom before a bowel movement. How is this diagnosed? This condition is diagnosed with a medical history and physical exam. You may also have other tests, including:  Blood tests.  Urine tests.  A rectal exam.  Ultrasound.  MRI.  Colonoscopy. This is an exam that looks at your large intestine (colon).  Anal manometry. This is a test that  measures the strength of the anal sphincter.  Anal electromyogram (EMG). This is a test that uses small electrodes to check for nerve damage. How is this treated? Treatment for this condition depends on the cause and severity. Treatment may also focus on addressing any underlying causes of this condition. Treatment may include:  Medicines. This may include medicines to: ? Prevent diarrhea. ? Help with constipation (bulk-forming laxatives). ? Treat any underlying conditions.  Biofeedback therapy. This can help to retrain muscles that are affected.  Fiber supplements. These can help manage your bowel movements.  Nerve stimulation.  Injectable gel to promote tissue growth and better muscle control.  Surgery. You may need: ? Sphincter repair surgery. ? Diversion surgery. This procedure lets feces pass out of your body through a hole in your abdomen. Follow these instructions at home: Eating and drinking   Follow instructions from your health care provider about any eating or drinking restrictions. ? Work with a dietitian to come up with a healthy diet that will help you avoid the foods that can make your condition worse. ? Keep a diet diary to find out which foods or drinks could be making your condition worse.  Drink enough fluid to keep your urine pale yellow. Lifestyle  Do not use any products that contain nicotine or tobacco, such as cigarettes and e-cigarettes. If you need help quitting, ask your health care provider. This may help your condition.  If you are overweight, talk with your health care provider about how to safely lose weight. This may help   your condition.  Increase your physical activity as told by your health care provider. This may help your condition. Always talk with your health care provider before starting a new exercise program.  Carry a change of clothes and supplies to clean up quickly if you have an episode of fetal incontinence.  Consider joining a  fecal incontinence support group. You can find a support group online or in your local community. General instructions   Take over-the-counter and prescription medicines only as told by your health care provider. This includes any supplements.  Apply a moisture barrier, such as petroleum jelly, to your rectum. This protects the skin from irritation caused by ongoing leaking or diarrhea.  Tell your health care provider if you are upset or depressed about your condition.  Keep all follow-up visits as told by your health care provider. This is important. Where to find more information  International Foundation for Functional Gastrointestinal Disorders: iffgd.org  American College of Gastroenterology: patients.gi.org Contact a health care provider if:  You have a fever.  You have redness, swelling, or pain around your rectum.  Your pain is getting worse or you lose feeling in your rectal area.  You have blood in your stool.  You feel sad or hopeless.  You avoid social or work situations. Get help right away if:  You stop having bowel movements.  You cannot eat or drink without vomiting.  You have rectal bleeding that does not stop.  You have severe pain that is getting worse.  You have symptoms of dehydration, including: ? Sleepiness or fatigue. ? Producing little or no urine, tears, or sweat. ? Dizziness. ? Dry mouth. ? Unusual irritability. ? Headache. ? Inability to think clearly. Summary  Fecal incontinence, also called accidental bowel leakage, is not being able to control your bowels. This condition happens because the nerves or muscles around the anus do not work the way they should.  Treatment varies depending on the cause and severity of your condition. Treatment may also focus on addressing any underlying causes of this condition.  Follow instructions from your health care provider about any eating or drinking restrictions, lifestyle changes, and skin care.   Take over-the-counter and prescription medicines only as told by your health care provider. This includes any supplements.  Tell your health care provider if your symptoms worsen or if you are upset or depressed about your condition. This information is not intended to replace advice given to you by your health care provider. Make sure you discuss any questions you have with your health care provider. Document Released: 01/24/2004 Document Revised: 06/26/2017 Document Reviewed: 06/26/2017 Elsevier Patient Education  2020 [employer]  

## 2018-09-16 NOTE — Progress Notes (Signed)
History:  73 y.o. [id number] here today for incontinence of stool x 2 this weekend. Pt has never has this issue prev and has not had a recurrence. She reports intermittent diarrhea that was not an issue at the time.  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  She has a bladder repair 2 years prev but,. Denies urinary incontinence.  The following portions of the patient's history were reviewed and updated as appropriate: allergies, current medications, past family history, past medical history, past social history, past surgical history and problem list.  Review of Systems:  Pertinent items are noted in HPI.    Objective:  Physical Exam Blood pressure (!) 144/60, pulse 78, height 5' 1.5" (1.562 m), weight 167 lb 0.6 oz (75.8 kg).  CONSTITUTIONAL: Well-developed, well-nourished female in no acute distress.  HENT:  Normocephalic, atraumatic EYES: Conjunctivae and EOM are normal. No scleral icterus.  NECK: Normal range of motion SKIN: Skin is warm and dry. No rash noted. Not diaphoretic.No pallor. [place]: Alert and oriented to person, place, and time. Normal coordination.  Abd: Soft, nontender and nondistended Pelvic: Normal appearing external genitalia; normal appearing vaginal mucosa. Normal discharge.  No stool in vault. Good anal tone and no stool incontinence with valsalva.    Assessment & Plan:  Incontinence of stool- first episode  Normal exam  Pt offered observation vs referral to pelvic PT  Pt would like to wait for now but, will consider PT if sx return  Total face-to-face time with patient was 15 min.  Greater than 50% was spent in counseling and coordination of care with the patient.  Brenda Mccullough L. Harraway-Smith, M.D., Cherlynn June

## 2018-09-16 NOTE — Progress Notes (Signed)
Pt states that she noticed stool in her underwear over the weekend.

## 2018-10-13 ENCOUNTER — Other Ambulatory Visit: Payer: Self-pay

## 2018-10-13 ENCOUNTER — Other Ambulatory Visit (INDEPENDENT_AMBULATORY_CARE_PROVIDER_SITE_OTHER): Payer: Medicare Other

## 2018-10-13 VITALS — BP 150/55 | HR 77 | Ht 61.5 in | Wt 170.1 lb

## 2018-10-13 DIAGNOSIS — R399 Unspecified symptoms and signs involving the genitourinary system: Secondary | ICD-10-CM | POA: Diagnosis not present

## 2018-10-13 LAB — POCT URINALYSIS DIPSTICK
Blood, UA: NEGATIVE
Glucose, UA: NEGATIVE
Nitrite, UA: NEGATIVE
Spec Grav, UA: 1.01 (ref 1.010–1.025)
pH, UA: 7 (ref 5.0–8.0)

## 2018-10-13 NOTE — Progress Notes (Signed)
Pt presents with UTI symptoms. Pt states that she has been experiencing urinary frequency and urgency x 1 month. UA showed moderate lymphocytes. Urine culture was sent to the lab.  Brenda Mccullough l Brenda Mccullough, CMA

## 2018-10-14 NOTE — Progress Notes (Signed)
Agree with plan to get urine culture Will treat if necessary

## 2018-10-15 LAB — URINE CULTURE

## 2019-07-11 ENCOUNTER — Emergency Department (HOSPITAL_BASED_OUTPATIENT_CLINIC_OR_DEPARTMENT_OTHER): Payer: Medicare Other

## 2019-07-11 ENCOUNTER — Emergency Department (HOSPITAL_BASED_OUTPATIENT_CLINIC_OR_DEPARTMENT_OTHER)
Admission: EM | Admit: 2019-07-11 | Discharge: 2019-07-11 | Disposition: A | Discharge status: Home / Self Care | Payer: Medicare Other | Attending: Emergency Medicine | Admitting: Emergency Medicine

## 2019-07-11 ENCOUNTER — Other Ambulatory Visit: Payer: Self-pay

## 2019-07-11 ENCOUNTER — Encounter (HOSPITAL_BASED_OUTPATIENT_CLINIC_OR_DEPARTMENT_OTHER): Payer: Self-pay | Admitting: Emergency Medicine

## 2019-07-11 DIAGNOSIS — M25471 Effusion, right ankle: Secondary | ICD-10-CM

## 2019-07-11 DIAGNOSIS — I10 Essential (primary) hypertension: Secondary | ICD-10-CM | POA: Diagnosis not present

## 2019-07-11 DIAGNOSIS — Z79899 Other long term (current) drug therapy: Secondary | ICD-10-CM | POA: Insufficient documentation

## 2019-07-11 DIAGNOSIS — Z7982 Long term (current) use of aspirin: Secondary | ICD-10-CM | POA: Diagnosis not present

## 2019-07-11 DIAGNOSIS — R2241 Localized swelling, mass and lump, right lower limb: Secondary | ICD-10-CM | POA: Diagnosis not present

## 2019-07-11 DIAGNOSIS — Z87891 Personal history of nicotine dependence: Secondary | ICD-10-CM | POA: Diagnosis not present

## 2019-07-11 NOTE — ED Provider Notes (Signed)
[facility] EMERGENCY DEPARTMENT Provider Note   CSN: [id number] Arrival date & time: 07/11/19  1223     History Chief Complaint  Patient presents with  . Foot Swelling    ankle swelling    Brenda Mccullough is a 74 y.o. female with PMH/o arthritis, diverticulitis, GERD, HTN who presents for evaluation of right ankle swelling. She first noticed it yesterday. She had been doing housework and sat down and noted swelling to the lateral malleolus of the right ankle. She does not recall and trauma, injury or fall. She has not had any associated pain. She does not normally get ankle swelling. She denies any fevers, warmth, erythema, numbness/weakness, CP, SOB. She denies any exogenous hormone use, recent immobilization, prior history of DVT/PE, recent surgery, leg swelling, or long travel.  The history is provided by the patient.       Past Medical History:  Diagnosis Date  . Arthritis   . Diverticulosis   . GERD (gastroesophageal reflux disease)   . Heart murmur   . Hiatal hernia   . Hypertension   . Mitral valve prolapse     Patient Active Problem List   Diagnosis Date Noted  . Post-operative state 09/12/2016  . Breast cancer screening, high risk patient 10/11/2015  . Urinary, incontinence, stress female 12/05/2011  . Prolapse of female pelvic organs 12/05/2011    Past Surgical History:  Procedure Laterality Date  . BACK SURGERY    . CARPAL TUNNEL RELEASE    . COLONOSCOPY    . CYSTOCELE REPAIR N/A 09/12/2016   Procedure: ANTERIOR REPAIR (CYSTOCELE);  Surgeon: Willodean Rosenthal, MD;  Location: [facility];  Service: Gynecology;  Laterality: N/A;  . KNEE SURGERY    . RECTOCELE REPAIR  09/12/2016   Procedure: POSTERIOR REPAIR (RECTOCELE) AND PERINEORRHAPHY;  Surgeon: Willodean Rosenthal, MD;  Location: [facility];  Service: Gynecology;;  . SALPINGOOPHORECTOMY Bilateral 09/12/2016   Procedure: BILATERAL SALPINGO OOPHORECTOMY;  Surgeon: Willodean Rosenthal, MD;   Location: [facility];  Service: Gynecology;  Laterality: Bilateral;  . VAGINAL HYSTERECTOMY N/A 09/12/2016   Procedure: HYSTERECTOMY VAGINAL;  Surgeon: Willodean Rosenthal, MD;  Location: [facility];  Service: Gynecology;  Laterality: N/A;     OB History    Gravida  3   Para  3   Term  3   Preterm      AB      Living  3     SAB      TAB      Ectopic      Multiple      Live Births  3           Family History  Problem Relation Age of Onset  . Cancer Mother   . Rheum arthritis Father   . Migraines Daughter     Social History   Tobacco Use  . Smoking status: Former Games developer  . Smokeless tobacco: Never Used  Substance Use Topics  . Alcohol use: No  . Drug use: No    Home Medications Prior to Admission medications   Medication Sig Start Date End Date Taking? Authorizing Provider  amitriptyline (ELAVIL) 25 MG tablet Take 1 tablet (25 mg total) by mouth at bedtime. Patient taking differently: Take 12.5-25 mg by mouth at bedtime as needed for sleep.  09/11/15   Willodean Rosenthal, MD  amLODipine (NORVASC) 10 MG tablet Take 10 mg by mouth daily.    [provider]  amoxicillin-clavulanate (AUGMENTIN) 875-125 MG tablet Take 1 tablet by mouth  2 (two) times daily. Patient not taking: Reported on 09/16/2018 10/04/16   Willodean Rosenthal, MD  aspirin EC 81 MG tablet Take 81 mg by mouth daily.    [provider]  Calcium Carbonate-Vitamin D (CALCIUM 600+D) 600-200 MG-UNIT TABS Take 1 tablet by mouth daily.    [provider]  cephALEXin (KEFLEX) 500 MG capsule Take 1 capsule (500 mg total) by mouth 2 (two) times daily. Patient not taking: Reported on 09/16/2018 07/03/17   Willodean Rosenthal, MD  cetirizine (ZYRTEC) 10 MG tablet Take 10 mg by mouth daily.    [provider]  cyanocobalamin 500 MCG tablet Take 500 mcg by mouth daily.    [provider]  docusate sodium (COLACE) 100 MG capsule Take 1 capsule (100 mg total)  by mouth 2 (two) times daily. Patient not taking: Reported on 09/16/2018 09/13/16   Willodean Rosenthal, MD  flavoxATE (URISPAS) 100 MG tablet Take 1 tablet (100 mg total) by mouth 3 (three) times daily. Patient not taking: Reported on 09/16/2018 09/13/16   Willodean Rosenthal, MD  fluticasone [facility]) 50 MCG/ACT nasal spray 1 spray by Each Nare route daily. 03/26/16 07/03/17  [provider]  hydrochlorothiazide (HYDRODIURIL) 25 MG tablet Take 25 mg by mouth daily.    [provider]  HYDROcodone-acetaminophen (NORCO/VICODIN) 5-325 MG tablet Take 1 tablet by mouth every 6 (six) hours as needed. Patient not taking: Reported on 09/16/2018 09/13/16   Tereso Newcomer, MD  metoprolol tartrate (LOPRESSOR) 25 MG tablet Take 12.5 mg by mouth 2 (two) times daily.    [provider]  naproxen (NAPROSYN) 500 MG tablet Take 1 tablet (500 mg total) by mouth 3 (three) times daily with meals. As needed for pain Patient not taking: Reported on 10/13/2018 09/13/16   Willodean Rosenthal, MD  Omega-3 Fatty Acids (FISH OIL) 1000 MG CAPS Take 1,000 mg by mouth daily.     [provider]  Omeprazole 20 MG TBEC Take 1 tablet (20 mg total) by mouth daily. 10/04/16   Willodean Rosenthal, MD  phenazopyridine (PYRIDIUM) 95 MG tablet Take 1 tablet (95 mg total) by mouth 3 (three) times daily as needed for pain. Patient not taking: Reported on 09/16/2018 09/16/16   Willodean Rosenthal, MD  pravastatin (PRAVACHOL) 20 MG tablet Take 20 mg by mouth at bedtime.  12/20/15 07/03/17  [provider]  pravastatin (PRAVACHOL) 20 MG tablet Take 20 mg by mouth daily.    [provider]    Allergies    Sulfa antibiotics  Review of Systems   Review of Systems  Constitutional: Negative for fever.  Musculoskeletal:       Right ankle swelling  Skin: Negative for color change.  Neurological: Negative for weakness and numbness.  All other systems reviewed and are  negative.   Physical Exam Updated Vital Signs BP (!) 156/64 (BP Location: Left Arm)   Pulse 71   Temp 98.4 F (36.9 C) (Oral)   Resp 18   Ht 5\' 1"  (1.549 m)   Wt 73.9 kg   LMP  (LMP Unknown)   SpO2 100%   BMI 30.80 kg/m   Physical Exam Vitals and nursing note reviewed.  Constitutional:      Appearance: She is well-developed.  HENT:     Head: Normocephalic and atraumatic.  Eyes:     General: No scleral icterus.       Right eye: No discharge.        Left eye: No discharge.  Conjunctiva/sclera: Conjunctivae normal.  Cardiovascular:     Pulses:          Radial pulses are 2+ on the right side and 2+ on the left side.       Dorsalis pedis pulses are 2+ on the right side and 2+ on the left side.  Pulmonary:     Effort: Pulmonary effort is normal.  Musculoskeletal:     Comments: Tenderness noted to the lateral malleolus of the right ankle with overlying soft tissue swelling. No overlying warmth, erythema. No deformity or crepitus noted. No tenderness to palpation noted to right calf. No bony tenderness noted to LLE.   Skin:    General: Skin is warm and dry.     Capillary Refill: Capillary refill takes less than 2 seconds.     Comments: Good distal cap refill.  RLE is not dusky in appearance or cool to touch.  Neurological:     Mental Status: She is alert.     Comments: Sensation intact along major nerve distributions of BLE  Psychiatric:        Speech: Speech normal.        Behavior: Behavior normal.     ED Results / Procedures / Treatments   Labs (all labs ordered are listed, but only abnormal results are displayed) Labs Reviewed - No data to display  EKG None  Radiology DG Ankle Complete Right  Result Date: 07/11/2019 CLINICAL DATA:  Right ankle swelling after doing housework yesterday. No specific injury. EXAM: RIGHT ANKLE - COMPLETE 3+ VIEW COMPARISON:  None. FINDINGS: No fracture or bone lesion. Ankle joint normally spaced and aligned.  No arthropathic  changes. Small to moderate-sized plantar calcaneal spur. Normal soft tissues. IMPRESSION: 1. No fracture or acute finding. 2. Plantar calcaneal spur.  No other abnormality. Electronically Signed   By: Lajean Manes M.D.   On: 07/11/2019 14:39    Procedures Procedures (including critical care time)  Medications Ordered in ED Medications - No data to display  ED Course  I have reviewed the triage vital signs and the nursing notes.  Pertinent labs & imaging results that were available during my care of the patient were reviewed by me and considered in my medical decision making (see chart for details).    MDM Rules/Calculators/A&P                      74 y.o. F with PMH/o HTN , GERD who presents for evaluation of right ankle swelling that she first noticed yesterday. No warmth, erythema. No fevers. Patient is afebrile, non-toxic appearing, sitting comfortably on examination table. Vital signs reviewed and stable. Patient is neurovascularly intact. On exam she has isolated soft tissue swelling noted to the lateral malleolus of the right ankle. No warmth or erythema. Consider sprain. History/physical exam is not concerning for septic arthritis, ischemic limb, DVT. Plan for XR for evaluation of any acute bony abnormality.   X-ray shows no acute bony abnormality.  There is a plantar calcaneal spur noted.  Discussed results with patient.  Patient has been ambulatory in the ED without any difficulty.  I discussed results with her.  We discussed at length regarding her symptoms.  At this time, she has isolated soft tissue swelling noted to the lateral malleolus.  She does not have any pain but does have some tenderness when I palpate the area.  Question if she had a mild sprain injury that she is unaware of.  At this time, her  exam does not show any overlying warmth, erythema.  She has no other swelling noted on the leg.  I discussed at length regarding further work-up here in the ED.  At this time, her  exam is not concerning for DVT.  Did discuss with patient regarding obtaining an ultrasound here in the ED.  We discussed risk versus benefits of obtaining one.  We engaged in shared decision making.  After discussion, patient opted out of ultrasound.  I feel that this is reasonable given her history/physical exam.  I encouraged at home supportive care measures.  Patient instructed follow-up with her primary care doctor. At this time, patient exhibits no emergent life-threatening condition that require further evaluation in ED or admission. Patient had ample opportunity for questions and discussion. All patient's questions were answered with full understanding. Strict return precautions discussed. Patient expresses understanding and agreement to plan.   Portions of this note were generated with Scientist, clinical (histocompatibility and immunogenetics). Dictation errors may occur despite best attempts at proofreading.   Final Clinical Impression(s) / ED Diagnoses Final diagnoses:  Right ankle swelling    Rx / DC Orders ED Discharge Orders    None       Maxwell Caul, PA-C 07/11/19 1511    Geoffery Lyons, MD 07/12/19 1114

## 2019-07-11 NOTE — ED Notes (Signed)
Pt discharged to home. Discharge instructions have been discussed with patient and/or family members. Pt verbally acknowledges understanding d/c instructions, and endorses comprehension to checkout at registration before leaving.  °

## 2019-07-11 NOTE — Discharge Instructions (Signed)
As we discussed, at home, elevate your leg on pillows help with swelling.  Also apply ice.  Follow-up with your primary care doctor in 2 to 4 days.  Return the emergency department for any worsening pain, redness or swelling of the angle that begins to spread, fevers, numbness/weakness, swelling of the leg or any other worsening or concerning symptoms.

## 2019-07-11 NOTE — ED Triage Notes (Signed)
Pt reports after doing housework yesterday, she noticed swelling to her right ankle. Pt denies injury or pain.

## 2020-10-10 ENCOUNTER — Telehealth: Payer: Self-pay | Admitting: General Practice

## 2020-10-10 NOTE — Telephone Encounter (Signed)
Left message on VM for pt to contact our office to schedule annual exam with Dr. Erin Fulling in October 2022.

## 2020-10-26 IMAGING — DX DG ANKLE COMPLETE 3+V*R*
3 series · 3 of 3 positions shown · non-contrast
Comparison: None.

CLINICAL DATA: Right ankle swelling after doing housework
yesterday. No specific injury.

EXAM:
RIGHT ANKLE - COMPLETE 3+ VIEW

[ankle ap]
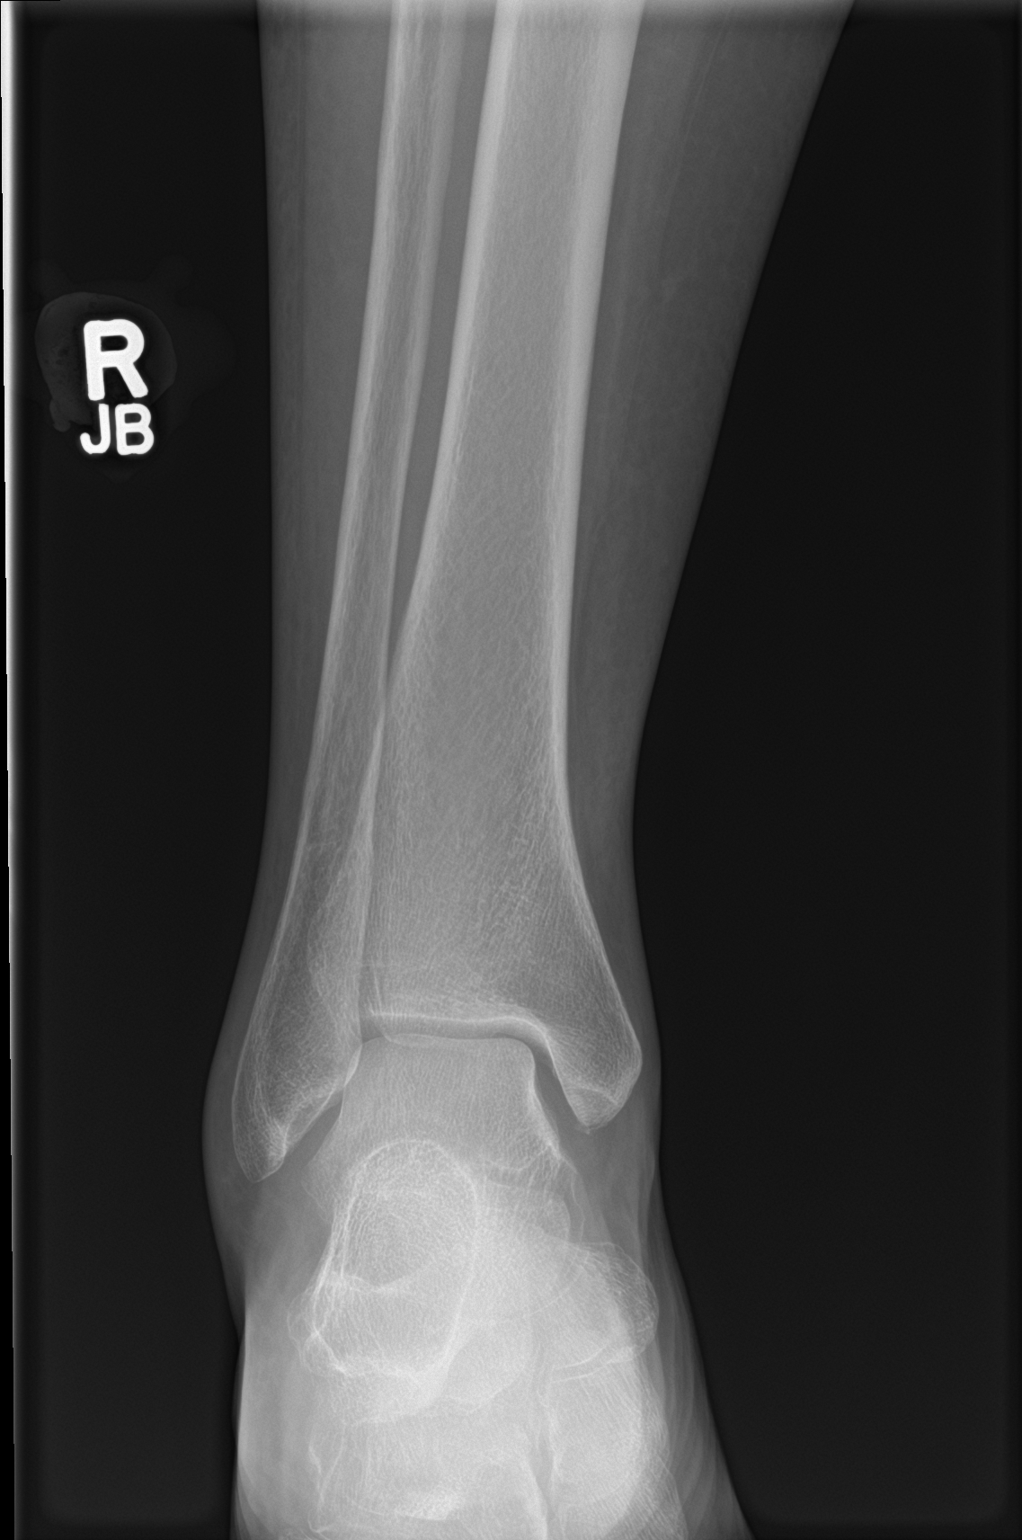

[ankle obl]
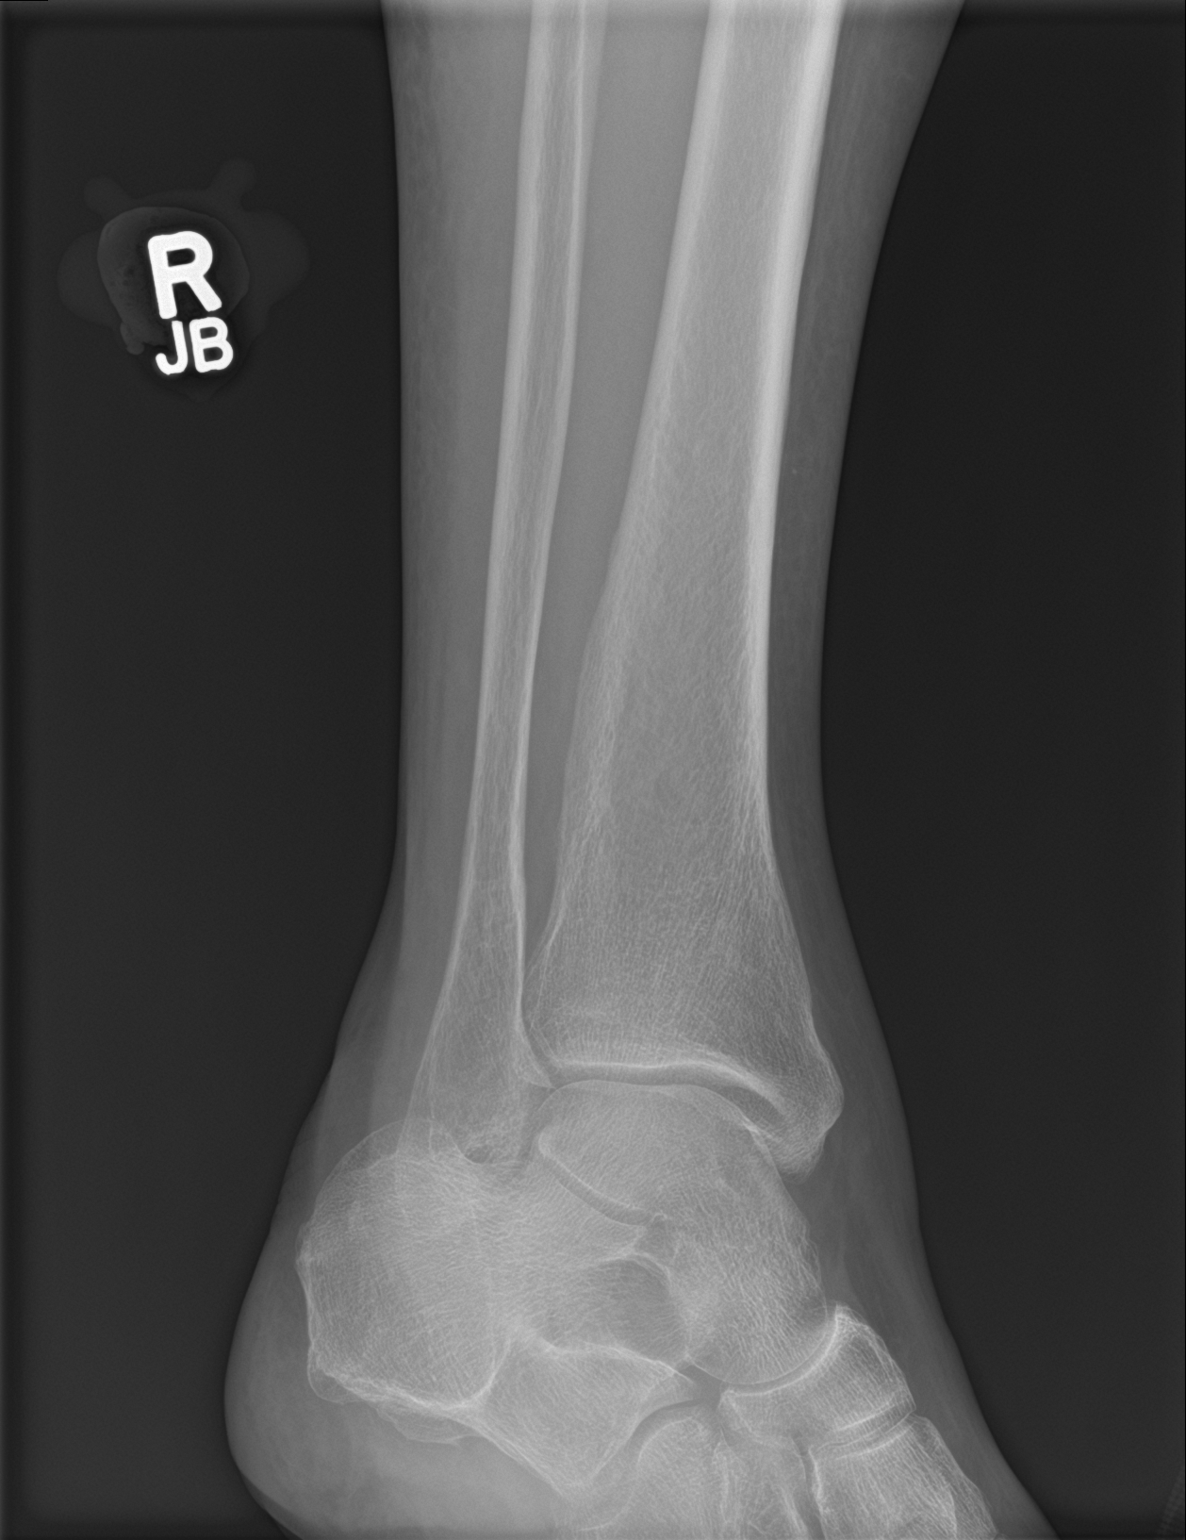

[ankle lat]
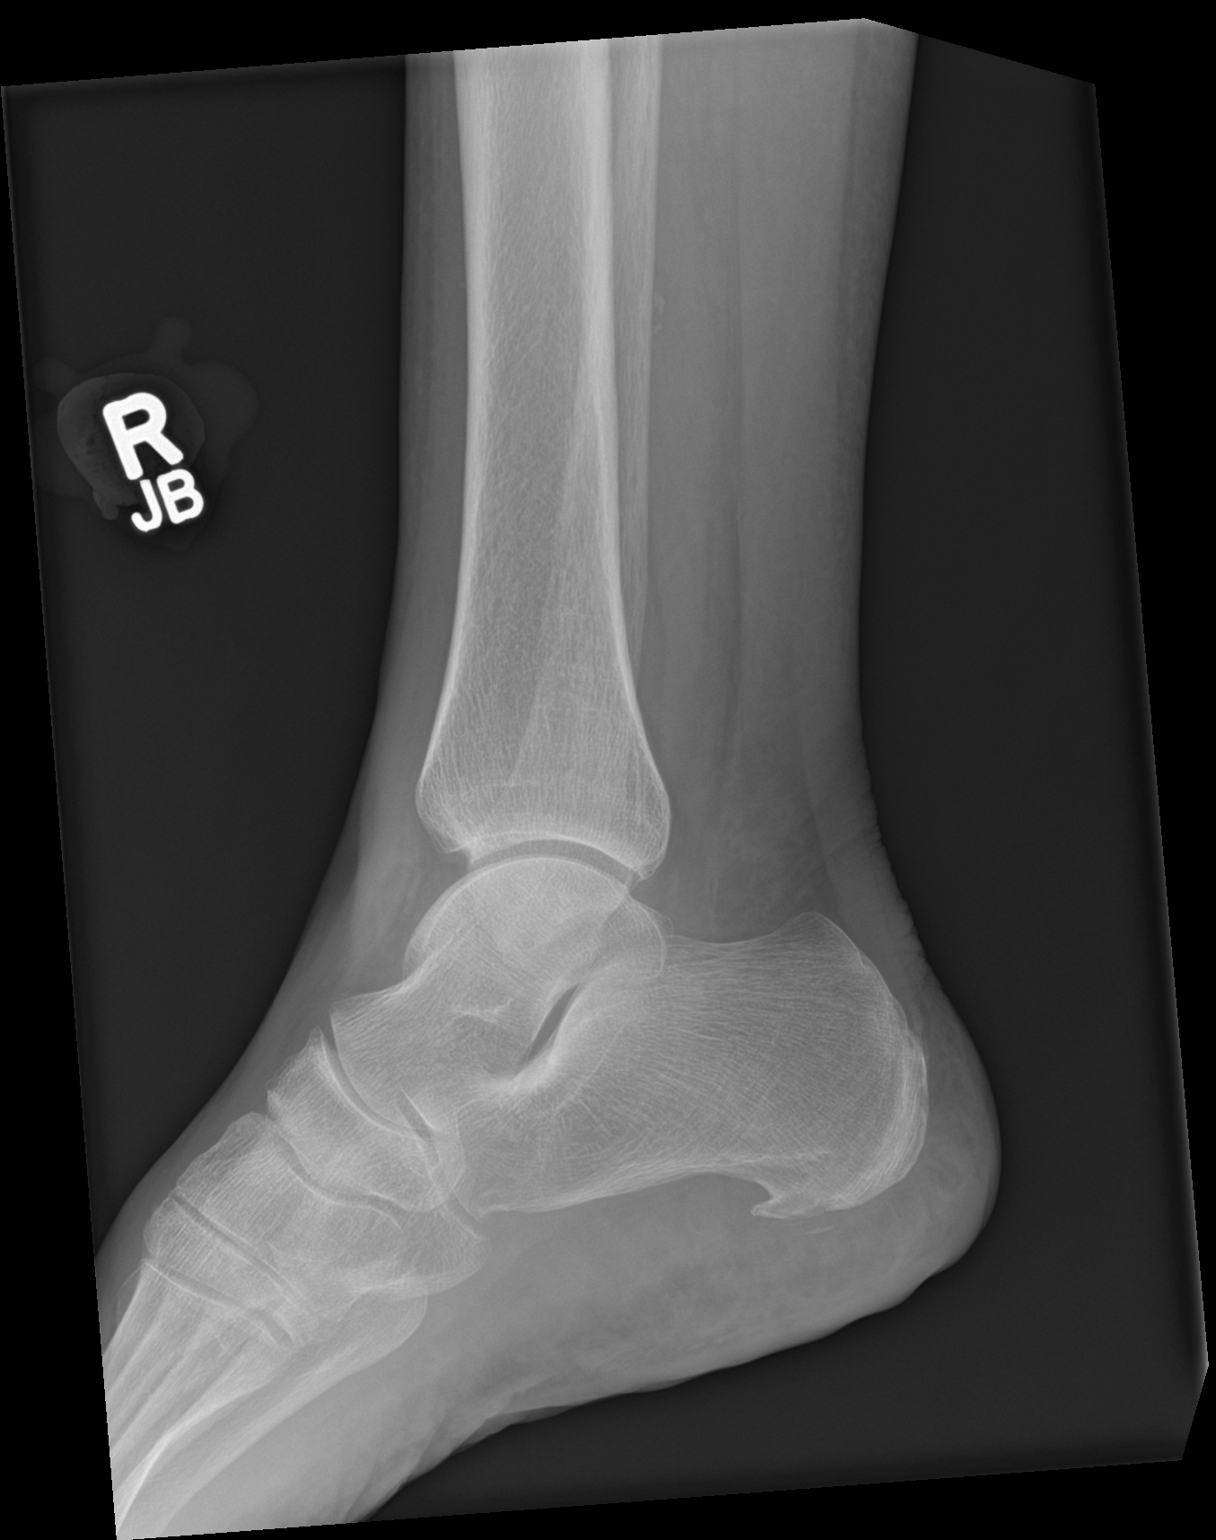

[3 of 3 positions shown; findings below may reference images not displayed]

FINDINGS: No fracture or bone lesion.

Ankle joint normally spaced and aligned.  No arthropathic changes.

Small to moderate-sized plantar calcaneal spur.

Normal soft tissues.
IMPRESSION: 1. No fracture or acute finding.
2. Plantar calcaneal spur.  No other abnormality.

## 2020-12-04 ENCOUNTER — Other Ambulatory Visit: Payer: Self-pay

## 2020-12-04 ENCOUNTER — Encounter: Payer: Self-pay | Admitting: Obstetrics & Gynecology

## 2020-12-04 ENCOUNTER — Telehealth: Payer: Self-pay

## 2020-12-04 ENCOUNTER — Ambulatory Visit (INDEPENDENT_AMBULATORY_CARE_PROVIDER_SITE_OTHER): Payer: Medicare Other | Admitting: Obstetrics & Gynecology

## 2020-12-04 VITALS — BP 133/57 | HR 66 | Ht 61.0 in | Wt 162.0 lb

## 2020-12-04 DIAGNOSIS — R300 Dysuria: Secondary | ICD-10-CM | POA: Diagnosis not present

## 2020-12-04 DIAGNOSIS — Z01419 Encounter for gynecological examination (general) (routine) without abnormal findings: Secondary | ICD-10-CM | POA: Diagnosis not present

## 2020-12-04 DIAGNOSIS — N949 Unspecified condition associated with female genital organs and menstrual cycle: Secondary | ICD-10-CM

## 2020-12-04 DIAGNOSIS — R3915 Urgency of urination: Secondary | ICD-10-CM | POA: Diagnosis not present

## 2020-12-04 MED ORDER — ESTRADIOL 0.1 MG/GM VA CREA: 1.0000 g | TOPICAL_CREAM | VAGINAL | 2 refills | Status: AC

## 2020-12-04 NOTE — Telephone Encounter (Signed)
Patient called back and states that [facility] was going to charge 55.90 per month for her  estrace. I offered patient to send to her [facility] mail in pharmacy and she doesn't want that as she thinks they will charge her more. Will route to provider for another option. Armandina Stammer RN

## 2020-12-04 NOTE — Progress Notes (Signed)
Subjective:     Brenda Mccullough is a 75 y.o. female here for a routine exam. Pt is s/p HYSTERECTOMY VAGINAL (N/A) ANTERIOR REPAIR (CYSTOCELE), BILATERAL SALPINGO OOPHORECTOMY and POSTERIOR REPAIR (RECTOCELE) AND PERINEORRHAPHY in 2018. She reports that she is still doing well post surgery. She reports that a few months ago, she had a UTI, it was treated but, she still feels some discomfort with urination at time. She also feels some urge sx and reports some freq at times. Lastly, she reports that at times, she goes to void and has some rectal staining. She denies incontinence of stool or urine. She is UTD on her breast, bone and colon cancer screening.      Gynecologic History No LMP recorded (lmp unknown). Patient is postmenopausal. Contraception: status post hysterectomy Last Pap: 05/12/2013. Results were: normal Last mammogram: 09/26/2020. Results were: normal [facility]  Obstetric History OB History  Gravida Para Term Preterm AB Living  3 3 3     3   SAB IAB Ectopic Multiple Live Births          3    # Outcome Date GA Lbr Len/2nd Weight Sex Delivery Anes PTL Lv  3 Term 06/24/79   6 lb (2.722 kg) F Vag-Spont   LIV  2 Term 02/15/71 [redacted]w[redacted]d  6 lb (2.722 kg) F Vag-Spont   LIV  1 Term 07/19/68 [redacted]w[redacted]d  5 lb 12 oz (2.608 kg) F Vag-Spont   LIV   The following portions of the patient's history were reviewed and updated as appropriate: allergies, current medications, past family history, past medical history, past social history, past surgical history, and problem list.  Review of Systems Pertinent items are noted in HPI.    Objective:  BP (!) 133/57   Pulse 66   Ht 5\' 1"  (1.549 m)   Wt 162 lb (73.5 kg)   LMP  (LMP Unknown)   BMI 30.61 kg/m   General Appearance:    Alert, cooperative, no distress, appears stated age  Head:    Normocephalic, without obvious abnormality, atraumatic  Eyes:    conjunctiva/corneas clear, EOM's intact, both eyes  Ears:    Normal external ear canals, both ears   Nose:   Nares normal, septum midline, mucosa normal, no drainage    or sinus tenderness  Throat:   Lips, mucosa, and tongue normal; teeth and gums normal  Neck:   Supple, symmetrical, trachea midline, no adenopathy;    thyroid:  no enlargement/tenderness/nodules  Back:     Symmetric, no curvature, ROM normal, no CVA tenderness  Lungs:     respirations unlabored  Chest Wall:    No tenderness or deformity   Heart:    Regular rate and Brenda  Breast Exam:    No tenderness, masses, or nipple abnormality  Abdomen:     Soft, non-tender, bowel sounds active all four quadrants,    no masses, no organomegaly  Genitalia:    Normal female without lesion, discharge or tenderness   Vaginal cuff well healed and supported. There is some atrophy c/w post menopausal state.   Extremities:   Extremities normal, atraumatic, no cyanosis or edema  Pulses:   2+ and symmetric all extremities  Skin:   Skin color, texture, turgor normal, no rashes or lesions    UA: neg Assessment:    Healthy female exam.  Urinary frequency and urgency post UTI. UA neg for acute UTI  Consider cranberry juice daily  Possibly related to GSM. Will add twice weekly EES.  Plan:   Brenda Mccullough was seen today for annual exam.  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   Pt to f/u if sx not improved with the cranberry juice and the topical EES.  F/u in 1 year or sooner prn    Brenda Mccullough, M.D., [facility]

## 2020-12-08 NOTE — Telephone Encounter (Signed)
Patient called back and states she went ahead and picked up her prescription at [facility]  Patient states she doesn't want script compounded. Armandina Stammer RN

## 2021-09-25 ENCOUNTER — Encounter: Payer: Self-pay | Admitting: General Practice

## 2021-10-24 ENCOUNTER — Telehealth: Payer: Self-pay | Admitting: General Practice

## 2021-10-24 NOTE — Telephone Encounter (Signed)
Left message on VM informing pt of yearly exam with Dr. Erin Fulling on 01/02/2022 at 2:10pm.  Pt was asked to contact our office to confirm appt and No Show info given to pt as well.  Pt did request if she did not answer to schedule appt and leave info on VM.

## 2022-01-02 ENCOUNTER — Ambulatory Visit (INDEPENDENT_AMBULATORY_CARE_PROVIDER_SITE_OTHER): Payer: Medicare Other | Admitting: Obstetrics & Gynecology

## 2022-01-02 ENCOUNTER — Encounter: Payer: Self-pay | Admitting: Obstetrics & Gynecology

## 2022-01-02 VITALS — BP 136/57 | HR 70 | Ht 61.0 in | Wt 164.0 lb

## 2022-01-02 DIAGNOSIS — Z13820 Encounter for screening for osteoporosis: Secondary | ICD-10-CM | POA: Diagnosis not present

## 2022-01-02 DIAGNOSIS — R151 Fecal smearing: Secondary | ICD-10-CM | POA: Diagnosis not present

## 2022-01-02 DIAGNOSIS — Z9181 History of falling: Secondary | ICD-10-CM

## 2022-01-02 DIAGNOSIS — Z01419 Encounter for gynecological examination (general) (routine) without abnormal findings: Secondary | ICD-10-CM

## 2022-01-02 NOTE — Progress Notes (Signed)
Subjective:     Brenda Mccullough is a 76 y.o. female here for a routine exam.  Pt is s/p hyst. Current complaints: Pt had a hyst and since has had no leaking of urine or prolapse. She does report occ staining of stool but, she declines further eval for now. Will consider PT if can see someone in [place] only. Does not want to dry to [place].  She wears a pad as a safety net and this will suffice for now.     Gynecologic History No LMP recorded (lmp unknown). Patient is postmenopausal. Contraception: post menopausal status Last Pap: 2015. Results were: normal Last mammogram: 11/19/2021. Results were: normal  Obstetric History OB History  Gravida Para Term Preterm AB Living  3 3 3     3   SAB IAB Ectopic Multiple Live Births          3    # Outcome Date GA Lbr Len/2nd Weight Sex Delivery Anes PTL Lv  3 Term 06/24/79   6 lb (2.722 kg) F Vag-Spont   LIV  2 Term 02/15/71 [redacted]w[redacted]d  6 lb (2.722 kg) F Vag-Spont   LIV  1 Term 07/19/68 [redacted]w[redacted]d  5 lb 12 oz (2.608 kg) F Vag-Spont   LIV     The following portions of the patient's history were reviewed and updated as appropriate: allergies, current medications, past family history, past medical history, past social history, past surgical history, and problem list.  Review of Systems Pertinent items are noted in HPI.    Objective:  BP (!) 136/57   Pulse 70   Ht 5\' 1"  (1.549 m)   Wt 164 lb (74.4 kg)   LMP  (LMP Unknown)   BMI 30.99 kg/m   General Appearance:    Alert, cooperative, no distress, appears stated age  Head:    Normocephalic, without obvious abnormality, atraumatic  Eyes:    conjunctiva/corneas clear, EOM's intact, both eyes  Ears:    Normal external ear canals, both ears  Nose:   Nares normal, septum midline, mucosa normal, no drainage    or sinus tenderness  Throat:   Lips, mucosa, and tongue normal; teeth and gums normal  Neck:   Supple, symmetrical, trachea midline, no adenopathy;    thyroid:  no enlargement/tenderness/nodules  Back:      Symmetric, no curvature, ROM normal, no CVA tenderness  Lungs:     respirations unlabored  Chest Wall:    No tenderness or deformity   Heart:    Regular rate and rhythm  Breast Exam:    No tenderness, masses, or nipple abnormality  Abdomen:     Soft, non-tender, bowel sounds active all four quadrants,    no masses, no organomegaly  Genitalia:    Normal female without lesion, discharge or tenderness   There is laxity of the muscles surrounding the anus.   Extremities:   Extremities normal, atraumatic, no cyanosis or edema  Pulses:   2+ and symmetric all extremities  Skin:   Skin color, texture, turgor normal, no rashes or lesions     Assessment:    Healthy female exam.    Plan:  Diagnoses and all orders for this visit:  Well female exam with routine gynecological exam  At risk for falling -     DG Bone Density; Future  Osteoporosis screening -     DG Bone Density; Future  Fecal smearing   Need to check to see if there is pelvic PT in [place] and refer  pt.   F/u in 1 year or sooner prn  Tawana Pasch L. Harraway-Smith, M.D., [facility]

## 2022-01-08 ENCOUNTER — Ambulatory Visit (HOSPITAL_BASED_OUTPATIENT_CLINIC_OR_DEPARTMENT_OTHER)
Admission: RE | Admit: 2022-01-08 | Discharge: 2022-01-08 | Disposition: A | Discharge status: Home / Self Care | Payer: Medicare Other | Source: Ambulatory Visit | Attending: Obstetrics & Gynecology | Admitting: Obstetrics & Gynecology

## 2022-01-08 DIAGNOSIS — Z13820 Encounter for screening for osteoporosis: Secondary | ICD-10-CM | POA: Insufficient documentation

## 2022-01-08 DIAGNOSIS — Z780 Asymptomatic menopausal state: Secondary | ICD-10-CM | POA: Diagnosis not present

## 2022-01-08 DIAGNOSIS — Z9181 History of falling: Secondary | ICD-10-CM | POA: Insufficient documentation

## 2022-01-08 DIAGNOSIS — M810 Age-related osteoporosis without current pathological fracture: Secondary | ICD-10-CM | POA: Insufficient documentation

## 2022-01-09 ENCOUNTER — Encounter: Payer: Self-pay | Admitting: Obstetrics & Gynecology

## 2022-02-07 ENCOUNTER — Telehealth: Payer: Self-pay | Admitting: General Practice

## 2022-02-07 NOTE — Telephone Encounter (Signed)
Left message on VM for patient to contact our office to discuss referral options for pelvic PT.

## 2022-02-07 NOTE — Telephone Encounter (Signed)
-----   Message from Willodean Rosenthal, MD sent at 02/06/2022  2:14 PM EST ----- Please call pt.   The options for Pelvic PT or [place] or [place].   Is she interested in either location ( she already said she doesn't want to go to [facility]).   Clh-S    ----- Message ----- From: Festus Aloe, RN Sent: 01/11/2022  11:14 AM EST To: Willodean Rosenthal, MD  [facility] know [facility] else who has pelvic PT but the only closest office is [place] [facility]. ----- Message ----- From: Willodean Rosenthal, MD Sent: 01/09/2022   7:05 PM EST To: Festus Aloe, RN  Miss [place] has fecal smearing and some laxity of the muscles surrounding the anus. She will go to PT ONLY if its in [place]. Do we know anyone in [place] that does pelvic PT even outside of [facility]?  If so, please send referral.   Thx  Clh-S

## 2022-04-03 ENCOUNTER — Telehealth: Payer: Self-pay

## 2022-04-03 DIAGNOSIS — M810 Age-related osteoporosis without current pathological fracture: Secondary | ICD-10-CM

## 2022-04-03 NOTE — Telephone Encounter (Signed)
Called patient to inform her that she has osteoporosis. Patient states she is not being treated for Osteoporosis. A referral for Sports Medicine was ordered. Understanding was voiced. Zyrion Coey l Jamika Sadek, CMA

## 2022-04-03 NOTE — Telephone Encounter (Signed)
-----   Message from Lavonia Drafts, MD sent at 04/03/2022  3:32 PM EST ----- Please call Brenda Mccullough,  She has osteoporosis. I don't see it on her problem list however, I don't believe this is new. Please ask her. If she is not being treated for this, please refer her to Sports med for treatment.   Thanks  Clh-S

## 2022-04-09 ENCOUNTER — Encounter: Payer: Self-pay | Admitting: Family Medicine

## 2022-04-09 ENCOUNTER — Ambulatory Visit: Payer: Medicare Other | Admitting: Family Medicine

## 2022-04-09 VITALS — BP 134/76 | Ht 61.0 in | Wt 164.0 lb

## 2022-04-09 DIAGNOSIS — M810 Age-related osteoporosis without current pathological fracture: Secondary | ICD-10-CM | POA: Diagnosis not present

## 2022-04-09 DIAGNOSIS — D508 Other iron deficiency anemias: Secondary | ICD-10-CM | POA: Diagnosis not present

## 2022-04-09 DIAGNOSIS — D649 Anemia, unspecified: Secondary | ICD-10-CM | POA: Insufficient documentation

## 2022-04-09 DIAGNOSIS — R7303 Prediabetes: Secondary | ICD-10-CM | POA: Diagnosis not present

## 2022-04-09 DIAGNOSIS — E559 Vitamin D deficiency, unspecified: Secondary | ICD-10-CM | POA: Diagnosis not present

## 2022-04-09 NOTE — Assessment & Plan Note (Signed)
Acute on chronic in nature.  - check TSH  - counseled on home exercise therapy

## 2022-04-09 NOTE — Assessment & Plan Note (Signed)
Acute on chronic in nature.  Has been noticed and also has restless leg syndrome. -Path review panel. -Iron and ferritin.

## 2022-04-09 NOTE — Progress Notes (Signed)
  Brenda Mccullough - 77 y.o. female MRN [id number]  Date of birth: 1945-11-02  SUBJECTIVE:  Including CC & ROS.  No chief complaint on file.   Brenda Mccullough is a 77 y.o. female that is presenting with osteoporosis.  She has a positive family history of osteoporosis.  She smoked tobacco for a period of time and quit in 2015.  She has a history of bilateral knee arthroplasty.  No personal history of cancer.  She has lost height over the past few years.  No reported fractures at this time.  Has a history of reflux.  Review of the bone density scan from 2020 shows a T-score of -2.9. Review of the bone density scan from 11/14 shows she has a T-score of -3.9.  Review of Systems See HPI   HISTORY: Past Medical, Surgical, Social, and Family History Reviewed & Updated per EMR.   Pertinent Historical Findings include:  Past Medical History:  Diagnosis Date   Arthritis    Diverticulosis    GERD (gastroesophageal reflux disease)    Heart murmur    Hiatal hernia    Hypertension    Mitral valve prolapse     Past Surgical History:  Procedure Laterality Date   BACK SURGERY     CARPAL TUNNEL RELEASE     COLONOSCOPY     CYSTOCELE REPAIR N/A 09/12/2016   Procedure: ANTERIOR REPAIR (CYSTOCELE);  Surgeon: Lavonia Drafts, MD;  Location: [facility];  Service: Gynecology;  Laterality: N/A;   KNEE SURGERY     RECTOCELE REPAIR  09/12/2016   Procedure: POSTERIOR REPAIR (RECTOCELE) AND PERINEORRHAPHY;  Surgeon: Lavonia Drafts, MD;  Location: [facility];  Service: Gynecology;;   SALPINGOOPHORECTOMY Bilateral 09/12/2016   Procedure: BILATERAL SALPINGO OOPHORECTOMY;  Surgeon: Lavonia Drafts, MD;  Location: [facility];  Service: Gynecology;  Laterality: Bilateral;   VAGINAL HYSTERECTOMY N/A 09/12/2016   Procedure: HYSTERECTOMY VAGINAL;  Surgeon: Lavonia Drafts, MD;  Location: [facility];  Service: Gynecology;  Laterality: N/A;     PHYSICAL EXAM:  VS: BP 134/76 (BP Location: Left Arm,  Patient Position: Sitting)   Ht 5\' 1"  (1.549 m)   Wt 164 lb (74.4 kg)   LMP  (LMP Unknown)   BMI 30.99 kg/m  Physical Exam Gen: NAD, alert, cooperative with exam, well-appearing MSK:  Neurovascularly intact       ASSESSMENT & PLAN:   Age-related osteoporosis without current pathological fracture Acute on chronic in nature.  She has not been treated for osteoporosis with worsening of the bone density.  It is severe in nature and she has lost height over time. -Counseled on home exercise therapy and supportive care. -PTH, CMP, SPEP and IFE, phosphorus and magnesium -Would pursue Evenity given the severity of her bone density of the positive family history and loss of her height.  Absolute anemia Acute on chronic in nature.  Has been noticed and also has restless leg syndrome. -Path review panel. -Iron and ferritin.  Prediabetes Acute on chronic in nature.  - check TSH  - counseled on home exercise therapy   Vitamin D deficiency Check vitamin D

## 2022-04-09 NOTE — Patient Instructions (Signed)
Nice to meet you Please try to start resistance training  We'll call with the lab results.  We'll call with the benefits for evenity   Please send me a message in MyChart with any questions or updates.  Please see me back to start the injections.   --Dr. Raeford Razor

## 2022-04-09 NOTE — Assessment & Plan Note (Signed)
Check vitamin D. 

## 2022-04-09 NOTE — Assessment & Plan Note (Signed)
Acute on chronic in nature.  She has not been treated for osteoporosis with worsening of the bone density.  It is severe in nature and she has lost height over time. -Counseled on home exercise therapy and supportive care. -PTH, CMP, SPEP and IFE, phosphorus and magnesium -Would pursue Evenity given the severity of her bone density of the positive family history and loss of her height.

## 2022-04-12 LAB — PATHOLOGIST SMEAR REVIEW
Basophils Absolute: 0 10*3/uL (ref 0.0–0.2)
Basos: 0 %
EOS (ABSOLUTE): 0.1 10*3/uL (ref 0.0–0.4)
Eos: 2 %
Hematocrit: 37.6 % (ref 34.0–46.6)
Hemoglobin: 12.3 g/dL (ref 11.1–15.9)
Immature Grans (Abs): 0 10*3/uL (ref 0.0–0.1)
Immature Granulocytes: 0 %
Lymphocytes Absolute: 1.1 10*3/uL (ref 0.7–3.1)
Lymphs: 19 %
MCH: 28 pg (ref 26.6–33.0)
MCHC: 32.7 g/dL (ref 31.5–35.7)
MCV: 86 fL (ref 79–97)
Monocytes Absolute: 0.7 10*3/uL (ref 0.1–0.9)
Monocytes: 12 %
Neutrophils Absolute: 3.9 10*3/uL (ref 1.4–7.0)
Neutrophils: 67 %
Platelets: 201 10*3/uL (ref 150–450)
RBC: 4.39 x10E6/uL (ref 3.77–5.28)
RDW: 13.1 % (ref 11.7–15.4)
WBC: 5.8 10*3/uL (ref 3.4–10.8)

## 2022-04-15 LAB — PROTEIN ELECTROPHORESIS, SERUM
A/G Ratio: 1.4 (ref 0.7–1.7)
Albumin ELP: 4 g/dL (ref 2.9–4.4)
Alpha 1: 0.2 g/dL (ref 0.0–0.4)
Alpha 2: 0.7 g/dL (ref 0.4–1.0)
Beta: 0.8 g/dL (ref 0.7–1.3)
Gamma Globulin: 1.2 g/dL (ref 0.4–1.8)
Globulin, Total: 2.9 g/dL (ref 2.2–3.9)

## 2022-04-15 LAB — IMMUNOFIXATION ELECTROPHORESIS
IgA/Immunoglobulin A, Serum: 109 mg/dL (ref 64–422)
IgG (Immunoglobin G), Serum: 1291 mg/dL (ref 586–1602)
IgM (Immunoglobulin M), Srm: 185 mg/dL (ref 26–217)
Total Protein: 6.9 g/dL (ref 6.0–8.5)

## 2022-04-15 LAB — COMPREHENSIVE METABOLIC PANEL
ALT: 12 IU/L (ref 0–32)
AST: 14 IU/L (ref 0–40)
Albumin/Globulin Ratio: 1.6 (ref 1.2–2.2)
Albumin: 4.2 g/dL (ref 3.8–4.8)
Alkaline Phosphatase: 101 IU/L (ref 44–121)
BUN/Creatinine Ratio: 13 (ref 12–28)
BUN: 10 mg/dL (ref 8–27)
Bilirubin Total: 0.4 mg/dL (ref 0.0–1.2)
CO2: 25 mmol/L (ref 20–29)
Calcium: 9.5 mg/dL (ref 8.7–10.3)
Chloride: 95 mmol/L — ABNORMAL LOW (ref 96–106)
Creatinine, Ser: 0.76 mg/dL (ref 0.57–1.00)
Globulin, Total: 2.7 g/dL (ref 1.5–4.5)
Glucose: 80 mg/dL (ref 70–99)
Potassium: 4.4 mmol/L (ref 3.5–5.2)
Sodium: 134 mmol/L (ref 134–144)
eGFR: 82 mL/min/{1.73_m2} (ref >59)

## 2022-04-15 LAB — IRON,TIBC AND FERRITIN PANEL
Ferritin: 107 ng/mL (ref 15–150)
Iron Saturation: 23 % (ref 15–55)
Iron: 70 ug/dL (ref 27–139)
Total Iron Binding Capacity: 305 ug/dL (ref 250–450)
UIBC: 235 ug/dL (ref 118–369)

## 2022-04-15 LAB — PTH, INTACT AND CALCIUM: PTH: 25 pg/mL (ref 15–65)

## 2022-04-15 LAB — MAGNESIUM: Magnesium: 1.8 mg/dL (ref 1.6–2.3)

## 2022-04-15 LAB — VITAMIN D 25 HYDROXY (VIT D DEFICIENCY, FRACTURES): Vit D, 25-Hydroxy: 45.4 ng/mL (ref 30.0–100.0)

## 2022-04-15 LAB — PHOSPHORUS: Phosphorus: 3.1 mg/dL (ref 3.0–4.3)

## 2022-04-15 LAB — TSH: TSH: 4.4 u[IU]/mL (ref 0.450–4.500)

## 2022-04-16 ENCOUNTER — Telehealth: Payer: Self-pay

## 2022-04-16 NOTE — Telephone Encounter (Signed)
Cresenciano Lick, CMA  Jasper Loser, CMA Evenity

## 2022-04-17 NOTE — Telephone Encounter (Signed)
Evenity VOB initiated via [email or web address]  New start   SUBJECTIVE:  Including CC & ROS.  No chief complaint on file.     DETTA Mccullough is a 77 y.o. female that is presenting with osteoporosis.  She has a positive family history of osteoporosis.  She smoked tobacco for a period of time and quit in 2015.  She has a history of bilateral knee arthroplasty.  No personal history of cancer.  She has lost height over the past few years.  No reported fractures at this time.  Has a history of reflux.   Review of the bone density scan from 2020 shows a T-score of -2.9. Review of the bone density scan from 11/14 shows she has a T-score of -3.9.   ASSESSMENT & PLAN:    Age-related osteoporosis without current pathological fracture Acute on chronic in nature.  She has not been treated for osteoporosis with worsening of the bone density.  It is severe in nature and she has lost height over time. -Counseled on home exercise therapy and supportive care. -PTH, CMP, SPEP and IFE, phosphorus and magnesium -Would pursue Evenity given the severity of her bone density of the positive family history and loss of her height.   Absolute anemia Acute on chronic in nature.  Has been noticed and also has restless leg syndrome. -Path review panel. -Iron and ferritin.   Prediabetes Acute on chronic in nature.  - check TSH  - counseled on home exercise therapy    Vitamin D deficiency Check vitamin D

## 2022-04-18 NOTE — Telephone Encounter (Signed)
Prior auth required for PROLIA  PA PROCESS DETAILS: Please complete the prior authorization form located at [email or web address] or call [phone]  

## 2022-04-19 ENCOUNTER — Telehealth: Payer: Self-pay | Admitting: Family Medicine

## 2022-04-19 NOTE — Telephone Encounter (Signed)
Informed of results.   Rosemarie Ax, MD [facility] 04/19/2022, 9:07 AM

## 2022-04-24 NOTE — Telephone Encounter (Signed)
PENDING  PA# [id number]

## 2022-05-04 NOTE — Telephone Encounter (Signed)
Pt is ready for scheduling on or after 05/04/22  Out-of-pocket cost due at time of visit: $475  Primary: [place] Medicare Adv HMOPOS Evenity co-insurance: 20% (approximately $450.53) Admin fee co-insurance: 20% (approximately $25)  Deductible: does not apply  Prior Auth: APPROVED PA# [id number] Valid: 04/24/22-04/25/23  Secondary: n/a Evenity co-insurance:  Admin fee co-insurance:   Deductible: n/a  Prior Auth: n/a PA# Valid:   ** This summary of benefits is an estimation of the patient's out-of-pocket cost. Exact cost may vary based on individual plan coverage.

## 2022-05-04 NOTE — Telephone Encounter (Signed)
APPROVED for Evenity  PA# [id number] Valid: 04/24/22-04/25/23

## 2022-05-06 NOTE — Telephone Encounter (Signed)
Pt informed of below.  Nurse visit scheduled.  ?

## 2022-05-14 ENCOUNTER — Ambulatory Visit: Payer: Medicare Other

## 2022-05-15 ENCOUNTER — Ambulatory Visit: Payer: Medicare Other | Admitting: *Deleted

## 2022-05-15 DIAGNOSIS — M810 Age-related osteoporosis without current pathological fracture: Secondary | ICD-10-CM | POA: Diagnosis not present

## 2022-05-15 MED ORDER — ROMOSOZUMAB-AQQG 105 MG/1.17ML ~~LOC~~ SOSY
210.0000 mg | PREFILLED_SYRINGE | Freq: Once | SUBCUTANEOUS | Status: AC
  Administered 2022-05-15: 210 mg via SUBCUTANEOUS

## 2022-05-15 NOTE — Progress Notes (Signed)
Patient is here for nurse visit for her 1st Evenity. Patient received bilateral [place] arm injections. She waited in the office for 15 minutes. She tolerated injections well. She will return in 1 month for her next Evenity injection.

## 2022-05-20 ENCOUNTER — Ambulatory Visit: Payer: Medicare Other

## 2022-06-10 ENCOUNTER — Encounter: Payer: Self-pay | Admitting: *Deleted

## 2022-06-17 ENCOUNTER — Ambulatory Visit (INDEPENDENT_AMBULATORY_CARE_PROVIDER_SITE_OTHER): Payer: Medicare Other | Admitting: *Deleted

## 2022-06-17 DIAGNOSIS — M810 Age-related osteoporosis without current pathological fracture: Secondary | ICD-10-CM

## 2022-06-17 MED ORDER — ROMOSOZUMAB-AQQG 105 MG/1.17ML ~~LOC~~ SOSY
210.0000 mg | PREFILLED_SYRINGE | Freq: Once | SUBCUTANEOUS | Status: AC
  Administered 2022-06-17: 210 mg via SUBCUTANEOUS

## 2022-06-17 NOTE — Progress Notes (Signed)
Patient is here for nurse visit for her 2nd Evenity. Patient received bilateral [place] arm injections. She tolerated injections well. She will return in 1 month for her next Evenity injection.

## 2022-06-19 NOTE — Telephone Encounter (Signed)
Evenity Injection Schedule Inj #1 - 05/15/22 Inj #2 - 06/17/22 Inj #3 - scheduled 07/18/22 Inj #4  Inj #5  Inj #6  Inj #7  Inj #8  Inj #9  Inj #10  Inj #11  Inj #12   Prior Auth: APPROVED PA# [id number] Valid: 04/24/22-04/25/23

## 2022-07-18 ENCOUNTER — Ambulatory Visit: Payer: Medicare Other | Admitting: *Deleted

## 2022-07-18 VITALS — Ht 61.0 in | Wt 164.0 lb

## 2022-07-18 DIAGNOSIS — M810 Age-related osteoporosis without current pathological fracture: Secondary | ICD-10-CM | POA: Diagnosis not present

## 2022-07-18 MED ORDER — ROMOSOZUMAB-AQQG 105 MG/1.17ML ~~LOC~~ SOSY
210.0000 mg | PREFILLED_SYRINGE | Freq: Once | SUBCUTANEOUS | Status: AC
  Administered 2022-07-18: 210 mg via SUBCUTANEOUS

## 2022-07-18 NOTE — Progress Notes (Signed)
Patient is here for nurse visit for her 3rd Evenity. Patient received bilateral [place] arm injections. She tolerated injections well. She will return in 1 month for her next Evenity injection.

## 2022-07-27 NOTE — Telephone Encounter (Signed)
Evenity Injection Schedule Inj #1 - 05/15/22 Inj #2 - 06/17/22 Inj #3 - 07/18/22  Prior Auth (Dr. Pearletha Forge): APPROVED PA# [id number] Valid: 07/27/22-07/27/23  Inj #4 - scheduled 08/20/22 Inj #5  Inj #6  Inj #7  Inj #8  Inj #9  Inj #10  Inj #11  Inj #12

## 2022-07-27 NOTE — Telephone Encounter (Signed)
Prior auth updated to Dr. Pearletha Forge  Prior Auth (Dr. Pearletha Forge): APPROVED PA# [id number] Valid: 07/27/22-07/27/23

## 2022-07-27 NOTE — Telephone Encounter (Signed)
Update PA to Dr. Pearletha Forge.

## 2022-08-15 ENCOUNTER — Telehealth: Payer: Self-pay | Admitting: Family Medicine

## 2022-08-15 NOTE — Telephone Encounter (Signed)
Patient & daughter called to ask about the Balance she owed for the Evenity injections --advised pt again of the    $450+ & $25 admi fee explained to her prior to starting the Evenity.(Per pt she thought $475 was a (1) time cost) and did Not understand that was for Each Evenity.injection.  Patient says Cannot afford the 12 shots @ $475 explained again that it was only owed, per her Ins until she reached the $3600 out of pocket then Ins would cover shot @ 100% patient still cannot afford.  Patient Cancelled all upcoming Evenity injection appts, says she & daughter are trying to see if she can qualify for any addt'l ins thru [employer]

## 2022-08-20 ENCOUNTER — Ambulatory Visit: Payer: Medicare Other

## 2022-08-24 ENCOUNTER — Encounter (HOSPITAL_BASED_OUTPATIENT_CLINIC_OR_DEPARTMENT_OTHER): Payer: Self-pay | Admitting: Emergency Medicine

## 2022-08-24 ENCOUNTER — Emergency Department (HOSPITAL_BASED_OUTPATIENT_CLINIC_OR_DEPARTMENT_OTHER): Payer: Medicare Other

## 2022-08-24 ENCOUNTER — Emergency Department (HOSPITAL_BASED_OUTPATIENT_CLINIC_OR_DEPARTMENT_OTHER)
Admission: EM | Admit: 2022-08-24 | Discharge: 2022-08-24 | Disposition: A | Discharge status: Home / Self Care | Payer: Medicare Other | Attending: Emergency Medicine | Admitting: Emergency Medicine

## 2022-08-24 ENCOUNTER — Other Ambulatory Visit: Payer: Self-pay

## 2022-08-24 DIAGNOSIS — I10 Essential (primary) hypertension: Secondary | ICD-10-CM | POA: Diagnosis not present

## 2022-08-24 DIAGNOSIS — M25552 Pain in left hip: Secondary | ICD-10-CM | POA: Diagnosis present

## 2022-08-24 DIAGNOSIS — Z79899 Other long term (current) drug therapy: Secondary | ICD-10-CM | POA: Insufficient documentation

## 2022-08-24 DIAGNOSIS — M79605 Pain in left leg: Secondary | ICD-10-CM

## 2022-08-24 DIAGNOSIS — Z7982 Long term (current) use of aspirin: Secondary | ICD-10-CM | POA: Diagnosis not present

## 2022-08-24 DIAGNOSIS — M79652 Pain in left thigh: Secondary | ICD-10-CM | POA: Diagnosis not present

## 2022-08-24 NOTE — ED Provider Notes (Signed)
[place] [facility] Provider Note   CSN: [id number] Arrival date & time: 08/24/22  1255     History  Chief Complaint  Patient presents with   Leg Pain    Brenda Mccullough is a 77 y.o. female.  Patient with history of anemia, prediabetes, high cholesterol on pravastatin --presents to the emergency department today for evaluation of left hip and thigh pain.  Patient states that for the past several months she has had "aches all over".  These have been migratory.  Today she had more pain in her left thigh and yesterday was more her left lower back and buttock area.  No falls or injuries.  No swelling.  No distal numbness or tingling.  No weakness.  No history of blood clots or recent surgeries.  Patient states that she was planning on going to see her primary care doctor, but is in the emergency department with another patient and decided to get checked while she was here.  No urine changes or dark urine.  She has been taking extra strength Tylenol with temporary improvement.       Home Medications Prior to Admission medications   Medication Sig Start Date End Date Taking? Authorizing Provider  amitriptyline (ELAVIL) 25 MG tablet Take 1 tablet (25 mg total) by mouth at bedtime. Patient taking differently: Take 12.5-25 mg by mouth at bedtime as needed for sleep. 09/11/15   Willodean Rosenthal, MD  amLODipine (NORVASC) 10 MG tablet Take 10 mg by mouth daily.    [provider]  aspirin EC 81 MG tablet Take 81 mg by mouth daily.    [provider]  Calcium Carbonate-Vitamin D 600-200 MG-UNIT TABS Take 1 tablet by mouth daily.    [provider]  cephALEXin (KEFLEX) 500 MG capsule Take 1 capsule (500 mg total) by mouth 2 (two) times daily. Patient not taking: Reported on 09/16/2018 07/03/17   Willodean Rosenthal, MD  cetirizine (ZYRTEC) 10 MG tablet Take 10 mg by mouth daily.    [provider]  cyanocobalamin 500  MCG tablet Take 500 mcg by mouth daily. Patient not taking: Reported on 01/02/2022    [provider]  docusate sodium (COLACE) 100 MG capsule Take 1 capsule (100 mg total) by mouth 2 (two) times daily. Patient not taking: Reported on 09/16/2018 09/13/16   Willodean Rosenthal, MD  estradiol (ESTRACE VAGINAL) 0.1 MG/GM vaginal cream Place 1 g vaginally 3 (three) times a week. 12/04/20   Willodean Rosenthal, MD  fexofenadine (ALLEGRA) 30 MG/5ML suspension Take 30 mg by mouth daily.    [provider]  flavoxATE (URISPAS) 100 MG tablet Take 1 tablet (100 mg total) by mouth 3 (three) times daily. Patient not taking: Reported on 09/16/2018 09/13/16   Willodean Rosenthal, MD  fluticasone [facility]) 50 MCG/ACT nasal spray 1 spray by Each Nare route daily. 03/26/16 07/03/17  [provider]  hydrochlorothiazide (HYDRODIURIL) 25 MG tablet Take 25 mg by mouth daily. Patient not taking: Reported on 01/02/2022    [provider]  HYDROcodone-acetaminophen (NORCO/VICODIN) 5-325 MG tablet Take 1 tablet by mouth every 6 (six) hours as needed. Patient not taking: Reported on 09/16/2018 09/13/16   Tereso Newcomer, MD  lactase (LACTAID) 3000 units tablet Take by mouth 3 (three) times daily with meals.    [provider]  metoprolol tartrate (LOPRESSOR) 25 MG tablet Take 12.5 mg by mouth 2 (two) times daily.    [provider]  Multiple Vitamin (MULTIVITAMIN)  tablet Take 1 tablet by mouth daily.    [provider]  naproxen (NAPROSYN) 500 MG tablet Take 1 tablet (500 mg total) by mouth 3 (three) times daily with meals. As needed for pain Patient not taking: Reported on 10/13/2018 09/13/16   Willodean Rosenthal, MD  Omega-3 Fatty Acids (FISH OIL) 1000 MG CAPS Take 1,000 mg by mouth daily.  Patient not taking: Reported on 12/04/2020    [provider]  Omeprazole 20 MG TBEC Take 1 tablet (20 mg total) by mouth daily. 10/04/16   Willodean Rosenthal, MD  phenazopyridine (PYRIDIUM) 95 MG tablet Take 1 tablet (95 mg total) by mouth 3 (three) times daily as needed for pain. Patient not taking: Reported on 09/16/2018 09/16/16   Willodean Rosenthal, MD  pravastatin (PRAVACHOL) 20 MG tablet Take 20 mg by mouth at bedtime.  Patient not taking: Reported on 01/02/2022 12/20/15 01/02/22  [provider]  pravastatin (PRAVACHOL) 20 MG tablet Take 20 mg by mouth daily.    [provider]  simethicone (MYLICON) 125 MG chewable tablet Chew 125 mg by mouth every 6 (six) hours as needed for flatulence.    [provider]      Allergies    Sulfa antibiotics    Review of Systems   Review of Systems  Physical Exam Updated Vital Signs BP (!) 196/76 (BP Location: Right Arm)   Pulse 79   Temp 98.5 F (36.9 C) (Oral)   Resp 20   Ht 5\' 1"  (1.549 m)   Wt 74.8 kg   LMP  (LMP Unknown)   SpO2 99%   BMI 31.18 kg/m   Physical Exam Vitals and nursing note reviewed.  Constitutional:      Appearance: She is well-developed.  HENT:     Head: Normocephalic and atraumatic.  Eyes:     Pupils: Pupils are equal, round, and reactive to light.  Cardiovascular:     Pulses: No decreased pulses.          Dorsalis pedis pulses are 2+ on the left side.  Musculoskeletal:        General: Tenderness present.     Cervical back: Normal range of motion and neck supple.     Lumbar back: No tenderness or bony tenderness.     Left hip: Tenderness (lateral) present. No bony tenderness. Normal range of motion.     Left upper leg: Tenderness present. No swelling, edema or bony tenderness.     Left knee: No swelling. Normal range of motion. No tenderness.  Skin:    General: Skin is warm and dry.  Neurological:     Mental Status: She is alert.     Sensory: No sensory deficit.     Comments: Motor, sensation, and vascular distal to the injury is fully intact.   Psychiatric:        Mood and Affect: Mood normal.     ED Results /  Procedures / Treatments   Labs (all labs ordered are listed, but only abnormal results are displayed) Labs Reviewed - No data to display  EKG None  Radiology DG Hip Unilat W or Wo Pelvis 2-3 Views Left  Result Date: 08/24/2022 CLINICAL DATA:  Left hip pain.  No known injury. EXAM: DG HIP (WITH OR WITHOUT PELVIS) 2-3V LEFT COMPARISON:  None Available. FINDINGS: There is no evidence of hip fracture or dislocation. There is no evidence of hip arthropathy. Mild pubic symphysis degenerative changes are seen. Iliac vascular calcification also noted. IMPRESSION: No  acute findings. Mild osteitis pubis. Electronically Signed   By: Danae Orleans M.D.   On: 08/24/2022 16:04   US Venous Img Lower  Left (DVT Study)  Result Date: 08/24/2022 CLINICAL DATA:  Left lower extremity and lateral hip pain for 1 week EXAM: LEFT LOWER EXTREMITY VENOUS DOPPLER ULTRASOUND TECHNIQUE: Gray-scale sonography with compression, as well as color and duplex ultrasound, were performed to evaluate the deep venous system(s) from the level of the common femoral vein through the popliteal and proximal calf veins. COMPARISON:  None Available. FINDINGS: VENOUS Normal compressibility of the left common femoral, superficial femoral, and popliteal veins, as well as the visualized calf veins. Visualized portions of profunda femoral vein and great saphenous vein unremarkable. No filling defects to suggest DVT on grayscale or color Doppler imaging. Doppler waveforms show normal direction of venous flow, normal respiratory plasticity and response to augmentation. Limited views of the contralateral right common femoral vein are unremarkable. OTHER None. Limitations: none IMPRESSION: No evidence of deep vein thrombosis in the left lower extremity. Electronically Signed   By: Jacob Moores M.D.   On: 08/24/2022 15:07    Procedures Procedures    Medications Ordered in ED Medications - No data to display  ED Course/ Medical Decision Making/  A&P    Patient seen and examined. History obtained directly from patient.   Labs/EKG: None ordered  Imaging: Ordered x-ray of the left hip, DVT study of the left leg  Medications/Fluids: None ordered  Most recent vital signs reviewed and are as follows: BP (!) 196/76 (BP Location: Right Arm)   Pulse 79   Temp 98.5 F (36.9 C) (Oral)   Resp 20   Ht 5\' 1"  (1.549 m)   Wt 74.8 kg   LMP  (LMP Unknown)   SpO2 99%   BMI 31.18 kg/m   Initial impression: Muscle aches and pains, migratory, chronic over the past couple of months.  Will rule out DVT and check x-ray given worsening pain in the left hip region.  4:28 PM Reassessment performed. Patient appears stable, reports discomfort from sitting in a hard chair.  Imaging personally visualized and interpreted including: X-ray of the hip and pelvis, agree negative.  Reviewed results of DVT study which were negative.  Reviewed pertinent lab work and imaging with patient at bedside. Questions answered.   Most current vital signs reviewed and are as follows: BP (!) 196/76 (BP Location: Right Arm)   Pulse 79   Temp 98.5 F (36.9 C) (Oral)   Resp 20   Ht 5\' 1"  (1.549 m)   Wt 74.8 kg   LMP  (LMP Unknown)   SpO2 99%   BMI 31.18 kg/m   Plan: Discharge to home.   Prescriptions written for: None  Other home care instructions discussed: Patient does not have any significant risk factors other than hypertension to preclude the use of NSAIDs.  Discussed with patient she may try 400 mg of ibuprofen every 6-8 hours alternated with Tylenol.  ED return instructions discussed: Return with worsening uncontrolled pain, leg swelling, new symptoms or other concerns.  Follow-up instructions discussed: Patient encouraged to follow-up with their PCP in 3-5 days for recheck.                               Medical Decision Making Amount and/or Complexity of Data Reviewed Radiology: ordered.   Patient presents for reported myalgias, now worse  in the left hip and  thigh area.  She is ambulatory.  She has been getting some relief with Tylenol but symptoms come back.  No lower back pain.  Evaluated with x-ray today which was essentially negative and DVT study which was negative.  No signs of cellulitis or shingles.  Consider possibility of lumbosacral radiculopathy.  She has also had migrating muscle pains which may be related to other underlying causes which can be worked up by her primary care doctor.  Patient looks well, nontoxic.  She will continue symptomatic treatment at home.  Encourage patient to take her home medications and follow-up with PCP for blood pressure recheck.  The patient's vital signs, pertinent lab work and imaging were reviewed and interpreted as discussed in the ED course. Hospitalization was considered for further testing, treatments, or serial exams/observation. However as patient is well-appearing, has a stable exam, and reassuring studies today, I do not feel that they warrant admission at this time. This plan was discussed with the patient who verbalizes agreement and comfort with this plan and seems reliable and able to return to the Emergency Department with worsening or changing symptoms.          Final Clinical Impression(s) / ED Diagnoses Final diagnoses:  Pain of left lower extremity  Hypertension, unspecified type    Rx / DC Orders ED Discharge Orders     None         Renne Crigler, PA-C 08/24/22 1631    Horton, Clabe Seal, DO 08/25/22 0710

## 2022-08-24 NOTE — ED Triage Notes (Addendum)
Pt c/o general aches and pains x couple weeks; worse in LLE today; no falls or injuries

## 2022-08-24 NOTE — Discharge Instructions (Addendum)
Please read and follow all provided instructions.  Your diagnoses today include:  1. Pain of left lower extremity   2. Hypertension, unspecified type     Tests performed today include: An x-ray of the affected area - does NOT show any broken bones or significant arthritis Ultrasound of the leg did not show any signs of blood clots Vital signs. See below for your results today.   Medications prescribed:  Please use over-the-counter NSAID medications (ibuprofen, naproxen) or Tylenol (acetaminophen) as directed on the packaging for pain -- as long as you do not have any reasons avoid these medications. Reasons to avoid NSAID medications include: weak kidneys, a history of bleeding in your stomach or gut, or uncontrolled high blood pressure or previous heart attack. Reasons to avoid Tylenol include: liver problems or ongoing alcohol use. Never take more than 4000mg  or 8 Extra strength Tylenol in a 24 hour period.      Take any prescribed medications only as directed.  Home care instructions:  Follow any educational materials contained in this packet Follow R.I.C.E. Protocol: R - rest your injury  I  - use ice on injury without applying directly to skin C - compress injury with bandage or splint E - elevate the injury as much as possible  Follow-up instructions: Please follow-up with your primary care provider if you continue to have significant pain in 1 week. In this case you may have a more severe injury that requires further care.   Return instructions:  Please return if your toes or feet are numb or tingling, appear gray or blue, or you have severe pain (also elevate the leg and loosen splint or wrap if you were given one) Please return to the Emergency Department if you experience worsening symptoms.  Please return if you have any other emergent concerns.  Additional Information:  Your vital signs today were: BP (!) 196/76 (BP Location: Right Arm)   Pulse 79   Temp 98.5 F  (36.9 C) (Oral)   Resp 20   Ht 5\' 1"  (1.549 m)   Wt 74.8 kg   LMP  (LMP Unknown)   SpO2 99%   BMI 31.18 kg/m  If your blood pressure (BP) was elevated above 135/85 this visit, please have this repeated by your doctor within one month. --------------

## 2022-10-01 NOTE — Telephone Encounter (Signed)
Evenity VOB re-verified per pt request.  Patient's last Evenity Inj #3 was on 07/18/22.  Prior Auth (Dr. Pearletha Forge): APPROVED PA# [id number] Valid: 07/27/22-07/27/23

## 2022-10-21 ENCOUNTER — Encounter: Payer: Self-pay | Admitting: *Deleted

## 2022-10-21 NOTE — Telephone Encounter (Addendum)
Pt ready for scheduling on or after: 10/21/22  Out-of-pocket cost due at time of visit: $0.00  Primary: [employer] Medicare Dual Complete Evenity co-insurance: 20% (approximately $468) Admin fee co-insurance: 20% (approximately $25)  Secondary: Medicaid Evenity co-insurance: 0% Admin fee co-insurance: 0%  Deductible:   Prior Auth: (Dr. Pearletha Forge): APPROVED  PA# [id number]  Valid: 07/27/22-07/27/23   ** This summary of benefits is an estimation of the patient's out-of-pocket cost. Exact cost may vary based on individual plan coverage.   I called pt's plan @ [phone] and spoke to [place]. Call ref # [phone]. Since pt has [employer] medicare Dual complete pt's OOP is $0.00.  VOB below is not completely accurate. 10/21/22/. SS/CMA  COVERAGE DETAILS: Evenity and administration will be subject to 20.0% coinsurance up to a $8,850.00 out of pocket max ($0.00 met). No deductible applies. The benefits provided on this Verification of Benefits form are the patient's In-Network benefits for Evenity. We have provided Medical benefits only. At this time [employer] does not provide retail pharmacy benefits for [employer].

## 2022-10-30 ENCOUNTER — Telehealth: Payer: Self-pay | Admitting: *Deleted

## 2022-10-30 NOTE — Telephone Encounter (Signed)
Notated pt does not want to continue evenity injections

## 2022-10-31 NOTE — Telephone Encounter (Signed)
Pt archived in [place] portal.

## 2022-11-16 NOTE — Telephone Encounter (Signed)
From: Carl Best  Sent: 10/30/2022   8:39 AM EDT  To: Annita Brod, CMA; Merrilyn Puma, CMA  Subject: Pt cld yesterday says has changed mind about*   Patient states she has changed her mind about returning to get the Evenity injections,she has started seeing a different doctor f

## 2023-01-08 ENCOUNTER — Encounter: Payer: Self-pay | Admitting: Obstetrics & Gynecology

## 2023-01-08 ENCOUNTER — Ambulatory Visit: Payer: 59 | Admitting: Obstetrics & Gynecology

## 2023-01-08 VITALS — BP 144/55 | HR 70 | Ht 61.0 in | Wt 166.0 lb

## 2023-01-08 DIAGNOSIS — Z1339 Encounter for screening examination for other mental health and behavioral disorders: Secondary | ICD-10-CM | POA: Diagnosis not present

## 2023-01-08 DIAGNOSIS — R159 Full incontinence of feces: Secondary | ICD-10-CM

## 2023-01-08 DIAGNOSIS — M810 Age-related osteoporosis without current pathological fracture: Secondary | ICD-10-CM

## 2023-01-08 DIAGNOSIS — Z01419 Encounter for gynecological examination (general) (routine) without abnormal findings: Secondary | ICD-10-CM

## 2023-01-08 MED ORDER — ALENDRONATE SODIUM 70 MG PO TABS
70.0000 mg | ORAL_TABLET | ORAL | 11 refills | Status: DC
Start: 2023-01-08 — End: 2023-10-22

## 2023-01-08 NOTE — Patient Instructions (Signed)
Alendronate Solution What is this medication? ALENDRONATE (a LEN droe nate) treats osteoporosis. It works by Paramedic stronger and less likely to break (fracture). It belongs to a group of medications called bisphosphonates. This medicine may be used for other purposes; ask your health care provider or pharmacist if you have questions. COMMON BRAND NAME(S): Fosamax What should I tell my care team before I take this medication? They need to know if you have any of these conditions: Bleeding disorder Cancer Dental disease Difficulty swallowing Infection (fever, chills, cough, sore throat, pain or trouble passing urine) Kidney disease Low levels of calcium or other minerals in the blood Low red blood cell counts Receiving steroids like dexamethasone or prednisone Stomach or intestine problems Trouble sitting or standing for 30 minutes An unusual or allergic reaction to alendronate, other medications, foods, dyes or preservatives Pregnant or trying to get pregnant Breast-feeding How should I use this medication? Take this medication by mouth with a full glass of water. Take it as directed on the prescription label at the same time every day. Use a specially marked oral syringe, spoon, or dropper to measure each dose. Ask your pharmacist if you do not have one. Household spoons are not accurate. Take the dose right after waking up. Do not eat or drink anything before taking it. Do not take it with any other drink except water. After taking it, do not eat breakfast, drink, or take any other medications or vitamins for at least 30 minutes. Sit or stand up for at least 30 minutes after you take it. Do not lie down. Keep taking it unless your care team tells you to stop. A special MedGuide will be given to you by the pharmacist with each prescription and refill. Be sure to read this information carefully each time. Talk to your care team about the use of this medication in children. Special  care may be needed. Overdosage: If you think you have taken too much of this medicine contact a poison control center or emergency room at once. NOTE: This medicine is only for you. Do not share this medicine with others. What if I miss a dose? If you take your medication once a day, skip it. Take your next dose at the scheduled time the next morning. Do not take two doses on the same day. If you take your medication once a week, take the missed dose on the morning after you remember. Do not take two doses on the same day. What may interact with this medication? Aluminum hydroxide Antacids Aspirin Calcium supplements Iron supplements Magnesium supplements Medications for inflammation like ibuprofen, naproxen, and others Vitamins with minerals This list may not describe all possible interactions. Give your health care provider a list of all the medicines, herbs, non-prescription drugs, or dietary supplements you use. Also tell them if you smoke, drink alcohol, or use illegal drugs. Some items may interact with your medicine. What should I watch for while using this medication? Visit your care team for regular checks on your progress. It may be some time before you see the benefit from this medication. Some people who take this medication have severe bone, joint, or muscle pain. This medication may also increase your risk for jaw problems or a broken thigh bone. Tell your care team right away if you have severe pain in your jaw, bones, joints, or muscles. Tell you care team if you have any pain that does not go away or that gets worse. Tell your dentist  and dental surgeon that you are taking this medication. You should not have major dental surgery while on this medication. See your dentist to have a dental exam and fix any dental problems before starting this medication. Take good care of your teeth while on this medication. Make sure you see your dentist for regular follow-up appointments. You  should make sure you get enough calcium and vitamin D while you are taking this medication. Discuss the foods you eat and the vitamins you take with your care team. You may need blood work done while you are taking this medication. What side effects may I notice from receiving this medication? Side effects that you should report to your care team as soon as possible: Allergic reactions--skin rash, itching, hives, swelling of the face, lips, tongue, or throat Low calcium level--muscle pain or cramps, confusion, tingling, or numbness in the hands or feet Osteonecrosis of the jaw--pain, swelling, or redness in the mouth, numbness of the jaw, poor healing after dental work, unusual discharge from the mouth, visible bones in the mouth Pain or trouble swallowing Severe bone, joint, or muscle pain Stomach bleeding--bloody or black, tar-like stools, vomiting blood or brown material that looks like coffee grounds Side effects that usually do not require medical attention (report to your care team if they continue or are bothersome): Constipation Diarrhea Nausea Stomach pain This list may not describe all possible side effects. Call your doctor for medical advice about side effects. You may report side effects to FDA at 1-800-FDA-1088. Where should I keep my medication? Keep out of the reach of children and pets. Store at room temperature between 20 and 25 degrees C (68 and 77 degrees F). Do not freeze. Throw away any unused medication after the expiration date. NOTE: This sheet is a summary. It may not cover all possible information. If you have questions about this medicine, talk to your doctor, pharmacist, or health care provider.  2024 Elsevier/Gold Standard (2020-02-10 00:00:00)

## 2023-01-08 NOTE — Progress Notes (Signed)
Subjective:     Brenda Mccullough is a 77 y.o. female here for a routine exam.  Current complaints: pt reports occ fecal incontinence. She prev declined PT eval due to the distance from her home. She would like to see a surgeon for eval if she can see someone close.   She reports being told that she had osteoporosis and was taking injections which become too expensive so she stopped. Also, [facility]  She would like to have some meds prescribed. She was going to her exercise class but, she stopped and 'needs to get back in the habit of going.'       Gynecologic History No LMP recorded (lmp unknown). Patient is postmenopausal. Contraception: post menopausal status Last Pap: n/a.  Last mammogram: 11/11/2022. Results were: normal at [facility]  Obstetric History OB History  Gravida Para Term Preterm AB Living  3 3 3     3   SAB IAB Ectopic Multiple Live Births          3    # Outcome Date GA Lbr Len/2nd Weight Sex Type Anes PTL Lv  3 Term 06/24/79   6 lb (2.722 kg) F Vag-Spont   LIV  2 Term 02/15/71 [redacted]w[redacted]d  6 lb (2.722 kg) F Vag-Spont   LIV  1 Term 07/19/68 [redacted]w[redacted]d  5 lb 12 oz (2.608 kg) F Vag-Spont   LIV     The following portions of the patient's history were reviewed and updated as appropriate: allergies, current medications, past family history, past medical history, past social history, past surgical history, and problem list.  Review of Systems Pertinent items are noted in HPI.    Objective:  BP (!) 144/55   Pulse 70   Ht 5\' 1"  (1.549 m)   Wt 166 lb (75.3 kg)   LMP  (LMP Unknown)   BMI 31.37 kg/m   General Appearance:    Alert, cooperative, no distress, appears stated age  Head:    Normocephalic, without obvious abnormality, atraumatic  Eyes:    conjunctiva/corneas clear, EOM's intact, both eyes  Ears:    Normal external ear canals, both ears  Nose:   Nares normal, septum midline, mucosa normal, no drainage    or sinus tenderness  Throat:    Lips, mucosa, and tongue normal; teeth and gums normal  Neck:   Supple, symmetrical, trachea midline, no adenopathy;    thyroid:  no enlargement/tenderness/nodules  Back:     Symmetric, no curvature, ROM normal, no CVA tenderness  Lungs:     respirations unlabored  Chest Wall:    No tenderness or deformity   Heart:    Regular rate and rhythm  Breast Exam:    No tenderness, masses, or nipple abnormality  Abdomen:     Soft, non-tender, bowel sounds active all four quadrants,    no masses, no organomegaly  Genitalia:    Normal female without lesion, discharge or tenderness   Uterus and cervix surgically absent. Rectal NOT DONE today.    Extremities:   Extremities normal, atraumatic, no cyanosis or edema  Pulses:   2+ and symmetric all extremities  Skin:   Skin color, texture, turgor normal, no rashes or lesions     Assessment:    Healthy female exam.  Fecal incontinence- prev discussed with normal rectal exam. Pt requests referral. The reports began after she had pelvic surgery. Will refer to colorectal surgeon fro eval.   Osteoporosis.   Discussed with  pt Fosamax and reviewed risks vs contraindications. Pt reports that she can take the pill on an empty stomach and sit up for following taking the pill.   Plan:   Diagnoses and all orders for this visit:  Well female exam with routine gynecological exam  Incontinence of feces, unspecified fecal incontinence type -     Cancel: Ambulatory referral to General Surgery -     Ambulatory referral to General Surgery  Osteoporosis of humerus -     alendronate (FOSAMAX) 70 MG tablet; Take 1 tablet (70 mg total) by mouth every 7 (seven) days. Take with a full glass of water on an empty stomach.   F/u in months or sooner prn   Manny Vitolo L. Harraway-Smith, M.D., [facility]

## 2023-04-30 ENCOUNTER — Ambulatory Visit: Payer: 59 | Admitting: Obstetrics & Gynecology

## 2023-05-07 ENCOUNTER — Encounter: Payer: Self-pay | Admitting: Obstetrics & Gynecology

## 2023-05-07 ENCOUNTER — Ambulatory Visit (INDEPENDENT_AMBULATORY_CARE_PROVIDER_SITE_OTHER): Admitting: Obstetrics & Gynecology

## 2023-05-07 VITALS — BP 147/59 | HR 72 | Wt 165.0 lb

## 2023-05-07 DIAGNOSIS — R151 Fecal smearing: Secondary | ICD-10-CM | POA: Diagnosis not present

## 2023-05-07 NOTE — Progress Notes (Signed)
 History:  78 y.o. [id number] here today for f/u of fecal smearing. Pt reported incontinence of small amounts of stool she was referred to [facility]. She was seen and was placed on fiber and since that time reports that she has had no further issues or concerns. She has started exercise class 3x/weeks and reports that she is doing well! No GYN concerns.   The following portions of the patient's history were reviewed and updated as appropriate: allergies, current medications, past family history, past medical history, past social history, past surgical history and problem list.  Review of Systems:  Pertinent items are noted in HPI.    Objective:  Physical Exam Blood pressure (!) 147/59, pulse 72, weight 165 lb (74.8 kg).  CONSTITUTIONAL: Well-developed, well-nourished female in no acute distress.  HENT:  Normocephalic, atraumatic EYES: Conjunctivae and EOM are normal. No scleral icterus.  NECK: Normal range of motion SKIN: Skin is warm and dry. No rash noted. Not diaphoretic.No pallor. NEUROLGIC: Alert and oriented to person, place, and time. Normal coordination.  Pelvic/rectal: exam not done. No indicated. Assessment & Plan:  Diagnoses and all orders for this visit:  Fecal smearing   Cont fiber supplements F/u for annual or sooner prn   Laraine Samet L. Harraway-Smith, M.D., [place]

## 2023-06-23 ENCOUNTER — Emergency Department (HOSPITAL_BASED_OUTPATIENT_CLINIC_OR_DEPARTMENT_OTHER)

## 2023-06-23 ENCOUNTER — Emergency Department (HOSPITAL_BASED_OUTPATIENT_CLINIC_OR_DEPARTMENT_OTHER)
Admission: EM | Admit: 2023-06-23 | Discharge: 2023-06-23 | Disposition: A | Discharge status: Home / Self Care | Attending: Emergency Medicine | Admitting: Emergency Medicine

## 2023-06-23 ENCOUNTER — Other Ambulatory Visit: Payer: Self-pay

## 2023-06-23 DIAGNOSIS — R1030 Lower abdominal pain, unspecified: Secondary | ICD-10-CM | POA: Insufficient documentation

## 2023-06-23 DIAGNOSIS — Z79899 Other long term (current) drug therapy: Secondary | ICD-10-CM | POA: Diagnosis not present

## 2023-06-23 DIAGNOSIS — Z7982 Long term (current) use of aspirin: Secondary | ICD-10-CM | POA: Diagnosis not present

## 2023-06-23 DIAGNOSIS — R079 Chest pain, unspecified: Secondary | ICD-10-CM | POA: Diagnosis not present

## 2023-06-23 DIAGNOSIS — S0990XA Unspecified injury of head, initial encounter: Secondary | ICD-10-CM | POA: Diagnosis not present

## 2023-06-23 DIAGNOSIS — Z85828 Personal history of other malignant neoplasm of skin: Secondary | ICD-10-CM | POA: Insufficient documentation

## 2023-06-23 DIAGNOSIS — M542 Cervicalgia: Secondary | ICD-10-CM | POA: Diagnosis present

## 2023-06-23 DIAGNOSIS — M25561 Pain in right knee: Secondary | ICD-10-CM | POA: Diagnosis not present

## 2023-06-23 DIAGNOSIS — Z87891 Personal history of nicotine dependence: Secondary | ICD-10-CM | POA: Diagnosis not present

## 2023-06-23 DIAGNOSIS — I10 Essential (primary) hypertension: Secondary | ICD-10-CM | POA: Insufficient documentation

## 2023-06-23 LAB — COMPREHENSIVE METABOLIC PANEL WITH GFR
ALT: 20 U/L (ref 0–44)
AST: 32 U/L (ref 15–41)
Albumin: 4.3 g/dL (ref 3.5–5.0)
Alkaline Phosphatase: 67 U/L (ref 38–126)
Anion gap: 13 (ref 5–15)
BUN: 8 mg/dL (ref 8–23)
CO2: 23 mmol/L (ref 22–32)
Calcium: 9.5 mg/dL (ref 8.9–10.3)
Chloride: 95 mmol/L — ABNORMAL LOW (ref 98–111)
Creatinine, Ser: 0.77 mg/dL (ref 0.44–1.00)
GFR, Estimated: >60 mL/min (ref >60)
Glucose, Bld: 96 mg/dL (ref 70–99)
Potassium: 4.2 mmol/L (ref 3.5–5.1)
Sodium: 131 mmol/L — ABNORMAL LOW (ref 135–145)
Total Bilirubin: 0.5 mg/dL (ref 0.0–1.2)
Total Protein: 7 g/dL (ref 6.5–8.1)

## 2023-06-23 LAB — CBC WITH DIFFERENTIAL/PLATELET
Abs Immature Granulocytes: 0.08 10*3/uL — ABNORMAL HIGH (ref 0.00–0.07)
Basophils Absolute: 0 10*3/uL (ref 0.0–0.1)
Basophils Relative: 0 %
Eosinophils Absolute: 0.1 10*3/uL (ref 0.0–0.5)
Eosinophils Relative: 1 %
HCT: 37.4 % (ref 36.0–46.0)
Hemoglobin: 12.8 g/dL (ref 12.0–15.0)
Immature Granulocytes: 1 %
Lymphocytes Relative: 8 %
Lymphs Abs: 0.9 10*3/uL (ref 0.7–4.0)
MCH: 28.9 pg (ref 26.0–34.0)
MCHC: 34.2 g/dL (ref 30.0–36.0)
MCV: 84.4 fL (ref 80.0–100.0)
Monocytes Absolute: 0.8 10*3/uL (ref 0.1–1.0)
Monocytes Relative: 7 %
Neutro Abs: 8.8 10*3/uL — ABNORMAL HIGH (ref 1.7–7.7)
Neutrophils Relative %: 83 %
Platelets: 213 10*3/uL (ref 150–400)
RBC: 4.43 MIL/uL (ref 3.87–5.11)
RDW: 13.1 % (ref 11.5–15.5)
WBC: 10.7 10*3/uL — ABNORMAL HIGH (ref 4.0–10.5)
nRBC: 0 % (ref 0.0–0.2)

## 2023-06-23 MED ORDER — OXYCODONE HCL 5 MG PO TABS: 5.0000 mg | ORAL_TABLET | Freq: Four times a day (QID) | ORAL | 0 refills | Status: AC | PRN

## 2023-06-23 MED ORDER — ONDANSETRON HCL 4 MG/2ML IJ SOLN
4.0000 mg | Freq: Once | INTRAMUSCULAR | Status: AC
  Administered 2023-06-23: 4 mg via INTRAVENOUS
  Filled 2023-06-23: qty 2

## 2023-06-23 MED ORDER — HYDROMORPHONE HCL 1 MG/ML IJ SOLN
0.5000 mg | Freq: Once | INTRAMUSCULAR | Status: AC
  Administered 2023-06-23: 0.5 mg via INTRAVENOUS
  Filled 2023-06-23: qty 1

## 2023-06-23 MED ORDER — IOHEXOL 300 MG/ML  SOLN
100.0000 mL | Freq: Once | INTRAMUSCULAR | Status: AC | PRN
  Administered 2023-06-23: 100 mL via INTRAVENOUS

## 2023-06-23 MED ORDER — ACETAMINOPHEN 500 MG PO TABS
1000.0000 mg | ORAL_TABLET | Freq: Once | ORAL | Status: AC
  Administered 2023-06-23: 1000 mg via ORAL
  Filled 2023-06-23: qty 2
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
## 2023-08-12 ENCOUNTER — Telehealth (HOSPITAL_BASED_OUTPATIENT_CLINIC_OR_DEPARTMENT_OTHER): Payer: Self-pay

## 2023-08-18 ENCOUNTER — Other Ambulatory Visit (HOSPITAL_BASED_OUTPATIENT_CLINIC_OR_DEPARTMENT_OTHER): Payer: Self-pay | Admitting: Family Medicine

## 2023-08-18 DIAGNOSIS — Z1231 Encounter for screening mammogram for malignant neoplasm of breast: Secondary | ICD-10-CM

## 2023-10-02 ENCOUNTER — Inpatient Hospital Stay (HOSPITAL_BASED_OUTPATIENT_CLINIC_OR_DEPARTMENT_OTHER): Admission: RE | Admit: 2023-10-02 | Source: Ambulatory Visit

## 2023-10-22 ENCOUNTER — Other Ambulatory Visit: Payer: Self-pay

## 2023-10-22 DIAGNOSIS — M810 Age-related osteoporosis without current pathological fracture: Secondary | ICD-10-CM

## 2023-10-22 MED ORDER — ALENDRONATE SODIUM 70 MG PO TABS
70.0000 mg | ORAL_TABLET | ORAL | 5 refills | Status: AC
Start: 2023-10-22 — End: ?

## 2023-11-17 ENCOUNTER — Ambulatory Visit (HOSPITAL_BASED_OUTPATIENT_CLINIC_OR_DEPARTMENT_OTHER)
Admission: RE | Admit: 2023-11-17 | Discharge: 2023-11-17 | Disposition: A | Discharge status: Home / Self Care | Source: Ambulatory Visit | Attending: Family Medicine | Admitting: Family Medicine

## 2023-11-17 ENCOUNTER — Encounter (HOSPITAL_BASED_OUTPATIENT_CLINIC_OR_DEPARTMENT_OTHER): Payer: Self-pay

## 2023-11-17 DIAGNOSIS — Z1231 Encounter for screening mammogram for malignant neoplasm of breast: Secondary | ICD-10-CM | POA: Insufficient documentation

## 2023-11-20 ENCOUNTER — Other Ambulatory Visit: Payer: Self-pay | Admitting: Family Medicine

## 2023-11-20 DIAGNOSIS — R928 Other abnormal and inconclusive findings on diagnostic imaging of breast: Secondary | ICD-10-CM

## 2023-11-26 ENCOUNTER — Ambulatory Visit
Admission: RE | Admit: 2023-11-26 | Discharge: 2023-11-26 | Disposition: A | Discharge status: Home / Self Care | Source: Ambulatory Visit | Attending: Family Medicine | Admitting: Family Medicine

## 2023-11-26 DIAGNOSIS — R928 Other abnormal and inconclusive findings on diagnostic imaging of breast: Secondary | ICD-10-CM

## 2024-01-05 NOTE — Progress Notes (Signed)
 Physical Therapy Visit - Daily Note   Payor: [employer] MEDICARE ADV / Plan: [employer] MA DUAL COMPLETE RPPO-SNP / Product Type: Medicare Advantage /   Visit Count: 3   Rehabilitation Precautions/Restrictions:   Precautions/Restrictions Precautions: HTN, OP, fall risk, BL TKA, hx of mitral valve prolapse        Referring Diagnosis: M47.816 - Lumbar spondylosis   SUBJECTIVE Patient Report:   Pt reports that she is stiff and sore today, notes no issues or complaints from last session Change in Status Since Last Visit: No Pain:  Pain Assessment Pain Assessment: No/denies pain  OBJECTIVE  General Observation/Objective Measures:         Patient ambulated into the clinic today and is in NAD         Interventions:  Therapeutic Exercise: Recumbent bike x 8' level 1  DKTC x 2' for increasing flexion tolerance  PB TA activation 2x12x3  Dead bugs w/ TA activation - LE only 2x10 ea  STS 3x10 2kg ball   Neuromuscular re-education: DL RDL w/ foam roller for hip hinging 2x12  Toe tapping on 6 3x10 ea   Education: Yes, as described in interventions   ASSESSMENT Pt noted to have some difficulty w/ hip hinging technique today but responded well to multiple verbal cuing to push hips back towards wall. She had some poor control w/ increased movement at the lumbar spine at mid range. We continued to progress w/ LE strengthening and some balance exercises w/ good response today. Will progress as tolerated at the next session.    Therapy Diagnosis:     ICD-10-CM   1. Lumbar spondylosis  M47.816   2. Chronic low back pain, unspecified back pain laterality, unspecified whether sciatica present  M54.50, G89.29   3. Sciatica of left side  M54.32   4. Decreased functional activity tolerance  R68.89   5. Impaired mobility and ADLs  Z74.09, Z78.9     Progress Towards Goals:     Goals Addressed             This Visit's Progress   . PT Goal       Short Term Goals (STG) - Time Frame 8  visits STG 1: Patient will be independent and compliant with all HEP STG 2: Patient will report 1/10 or less back pain at rest in order to allow progression toward LTG. STG 3: Patient will be able to reach forward w/o pain during any of the movement to get back to picking up stuff off the floor  Long Term Goals (LTG) - Time Frame 16 visits LTG 1: Patient will improve ODI score to <16 in order to demonstrate statistically significant improvement in patient perceived function. LTG 2: Patient will be able to demonstrate 5xSTS in under 15 seconds to improve her LE power and strength LTG 3: Patient will be able to demonstrate SLS for at least 15 seconds to decrease fall risk  LTG 4: Patient will be able to get back to her regular IADLS w/o pain in the low back after 1 hour of work          PLAN Treatment Frequency and Duration:  PT Frequency: 1x/week Treatment Plan Details: 1x/week for 16 visits  Recommended PT Treatment/Interventions: Dry needling (1-2 muscles) (79439); Dry Needling (3+ muscles) (79438); Electrical stimulation-unattended (02985); Manual therapy (97140); Neuromuscular re-education [phone]); Therapeutic activity (97530); Therapeutic exercise (97110); ADL skills [phone]); Self-care/home management [phone]); Ultrasound (02964)   Recommended Consults:  None currently  Development of Plan of  Care:  No change in POC.  Total Treatment Time (Time & Untimed): Total Treatment Time: 40 Total Time in Timed Codes: Time in Timed Codes: 40     Treatment/Procedures Neuromuscular Reeducation minutes: 10 Therapeutic Exercises minutes: 30             The patient has been instructed to contact our clinic if any questions or problems should arise.

## 2024-01-12 ENCOUNTER — Encounter: Payer: Self-pay | Admitting: Obstetrics & Gynecology

## 2024-01-12 ENCOUNTER — Ambulatory Visit: Admitting: Obstetrics & Gynecology

## 2024-01-12 VITALS — BP 143/49 | HR 70 | Ht 61.0 in | Wt 159.0 lb

## 2024-01-12 DIAGNOSIS — Z01419 Encounter for gynecological examination (general) (routine) without abnormal findings: Secondary | ICD-10-CM

## 2024-01-12 DIAGNOSIS — M810 Age-related osteoporosis without current pathological fracture: Secondary | ICD-10-CM

## 2024-01-12 NOTE — Progress Notes (Signed)
 GYNECOLOGY ANNUAL PREVENTATIVE CARE ENCOUNTER NOTE  History:    Brenda Mccullough is a 78 y.o. [phone] female with history of hysterectomy for benign indications many years ago here for a routine annual gynecologic exam.  Current complaints: none. Feels her fecal incontinence issues are much improved.  Denies abnormal vaginal bleeding, discharge, pelvic pain, or other gynecologic concerns.  Gynecologic History No LMP recorded (lmp unknown). Patient is postmenopausal. Last Mammogram: 11/26/2023.  Result was normal DEXA Scan:01/08/2022.  Patient has osteoporosis  Last Colonoscopy: 10/28/2000.  Result was normal, testing discontinued under Health Maintenance  Obstetric History OB History  Gravida Para Term Preterm AB Living  3 3 3   3   SAB IAB Ectopic Multiple Live Births      3    # Outcome Date GA Lbr Len/2nd Weight Sex Type Anes PTL Lv  3 Term 06/24/79   6 lb (2.722 kg) F Vag-Spont   LIV  2 Term 02/15/71 [redacted]w[redacted]d  6 lb (2.722 kg) F Vag-Spont   LIV  1 Term 07/19/68 [redacted]w[redacted]d  5 lb 12 oz (2.608 kg) F Vag-Spont   LIV    Past Medical History:  Diagnosis Date   Arthritis    Diverticulosis    GERD (gastroesophageal reflux disease)    Heart murmur    Hiatal hernia    Hypertension    Mitral valve prolapse    Osteoporosis     Past Surgical History:  Procedure Laterality Date   BACK SURGERY     CARPAL TUNNEL RELEASE     COLONOSCOPY     CYSTOCELE REPAIR N/A 09/12/2016   Procedure: ANTERIOR REPAIR (CYSTOCELE);  Surgeon: Corene Coy, MD;  Location: [facility];  Service: Gynecology;  Laterality: N/A;   KNEE SURGERY     RECTOCELE REPAIR  09/12/2016   Procedure: POSTERIOR REPAIR (RECTOCELE) AND PERINEORRHAPHY;  Surgeon: Corene Coy, MD;  Location: [facility];  Service: Gynecology;;   SALPINGOOPHORECTOMY Bilateral 09/12/2016   Procedure: BILATERAL SALPINGO OOPHORECTOMY;  Surgeon: Corene Coy, MD;  Location: [facility];  Service: Gynecology;  Laterality: Bilateral;    VAGINAL HYSTERECTOMY N/A 09/12/2016   Procedure: HYSTERECTOMY VAGINAL;  Surgeon: Corene Coy, MD;  Location: [facility];  Service: Gynecology;  Laterality: N/A;    Current Outpatient Medications on File Prior to Visit  Medication Sig Dispense Refill   amitriptyline  (ELAVIL ) 25 MG tablet Take 1 tablet (25 mg total) by mouth at bedtime. 30 tablet 3   amLODipine  (NORVASC ) 10 MG tablet Take 10 mg by mouth daily.     aspirin  EC 81 MG tablet Take 81 mg by mouth daily.     Calcium Carbonate-Vitamin D  600-200 MG-UNIT TABS Take 1 tablet by mouth daily.     cetirizine (ZYRTEC) 10 MG tablet Take 10 mg by mouth daily.     lactase (LACTAID) 3000 units tablet Take by mouth 3 (three) times daily with meals.     meloxicam (MOBIC) 15 MG tablet Take by mouth.     metoprolol  tartrate (LOPRESSOR ) 25 MG tablet Take 12.5 mg by mouth 2 (two) times daily.     Multiple Vitamin (MULTIVITAMIN) tablet Take 1 tablet by mouth daily.     Omeprazole  20 MG TBEC Take 1 tablet (20 mg total) by mouth daily. 90 each 3   oxyCODONE  (ROXICODONE ) 5 MG immediate release tablet Take 1 tablet (5 mg total) by mouth every 6 (six) hours as needed for severe pain (pain score 7-10). 10 tablet 0   pravastatin (PRAVACHOL) 20 MG tablet  Take 20 mg by mouth daily.     simethicone  (MYLICON) 125 MG chewable tablet Chew 125 mg by mouth every 6 (six) hours as needed for flatulence.     alendronate  (FOSAMAX ) 70 MG tablet Take 1 tablet (70 mg total) by mouth every 7 (seven) days. Take with a full glass of water on an empty stomach. (Patient not taking: Reported on 01/12/2024) 4 tablet 5   estradiol  (ESTRACE  VAGINAL) 0.1 MG/GM vaginal cream Place 1 g vaginally 3 (three) times a week. 42.5 g 2   fexofenadine (ALLEGRA) 30 MG/5ML suspension Take 30 mg by mouth daily.     fluticasone (FLONASE) 50 MCG/ACT nasal spray 1 spray by Each Nare route daily.     No current facility-administered medications on file prior to visit.    Allergies  Allergen  Reactions   Sulfa Antibiotics     Social History:  reports that she has quit smoking. She has never used smokeless tobacco. She reports that she does not drink alcohol and does not use drugs.  Family History  Problem Relation Age of Onset   Cancer Mother    Rheum arthritis Father    Migraines Daughter     The following portions of the patient's history were reviewed and updated as appropriate: allergies, current medications, past family history, past medical history, past social history, past surgical history and problem list.  Review of Systems Pertinent items noted in HPI and remainder of comprehensive ROS otherwise negative.  Physical Exam: Chaperone Shawnee Fleet, CMA  BP (!) 143/49 (BP Location: Right Arm, Patient Position: Sitting, Cuff Size: Normal)   Pulse 70   Ht 5' 1 (1.549 m)   Wt 159 lb (72.1 kg)   LMP  (LMP Unknown)   BMI 30.04 kg/m  CONSTITUTIONAL: Well-developed, well-nourished female in no acute distress.  HENT:  Normocephalic, atraumatic, External right and left ear normal.  EYES: Conjunctivae and EOM are normal. Pupils are equal, round, and reactive to light. No scleral icterus.  NECK: Normal range of motion, supple, no masses observed. SKIN: Skin is warm and dry. No rash noted. Not diaphoretic. No erythema. No pallor. MUSCULOSKELETAL: Normal range of motion. No tenderness.  No cyanosis, clubbing, or edema. NEUROLOGIC: Alert and oriented to person, place, and time. Normal muscle tone coordination.  PSYCHIATRIC: Normal mood and affect. Normal behavior. Normal judgment and thought content. CARDIOVASCULAR: Normal heart rate noted, regular rhythm RESPIRATORY: Clear to auscultation bilaterally. Effort and breath sounds normal, no problems with respiration noted. BREASTS: Symmetric in size. No masses, tenderness, skin changes, nipple drainage, or lymphadenopathy bilaterally. Performed in the presence of a chaperone. ABDOMEN: Soft, no distention noted.  No tenderness,  rebound or guarding.  PELVIC: Normal appearing external genitalia and urethral meatus; normal appearing  distal vaginal mucosa. Performed in the presence of a chaperone.  Assessment and Plan:     1. Osteoporosis of humerus Continue Fosamax , weight bearing exercises, Vitamin D  and Calcium intake. DEXA scan ordered, will follow up results and manage accordingly. - DG Bone Density; Future  2. Well female exam with routine gynecological exam (Primary) Normal exam. No concerns.  Mammogram  is up to date. Other health issues followed by other specialists. Routine preventative health maintenance measures emphasized. Please refer to After Visit Summary for other counseling recommendations.      GLORIS HUGGER, MD, FACOG Obstetrician & Gynecologist, [facility] for [employer], [facility]

## 2024-03-04 ENCOUNTER — Ambulatory Visit (HOSPITAL_BASED_OUTPATIENT_CLINIC_OR_DEPARTMENT_OTHER)
Admission: RE | Admit: 2024-03-04 | Discharge: 2024-03-04 | Disposition: A | Discharge status: Home / Self Care | Source: Ambulatory Visit | Attending: Obstetrics & Gynecology | Admitting: Obstetrics & Gynecology

## 2024-03-04 DIAGNOSIS — M810 Age-related osteoporosis without current pathological fracture: Secondary | ICD-10-CM | POA: Diagnosis present
